# Patient Record
Sex: Female | Born: 1965 | Race: White | Hispanic: No | Marital: Married | Smoking: Current every day smoker
Health system: Southern US, Community
[De-identification: ages and names within clinical notes are randomized; demographics above are authoritative.]

## PROBLEM LIST (undated history)

## (undated) ENCOUNTER — Emergency Department: Discharge status: Absent without leave | Payer: Self-pay

## (undated) DIAGNOSIS — G35 Multiple sclerosis: Secondary | ICD-10-CM

## (undated) DIAGNOSIS — G35D Multiple sclerosis, unspecified: Secondary | ICD-10-CM

## (undated) DIAGNOSIS — N261 Atrophy of kidney (terminal): Secondary | ICD-10-CM

## (undated) DIAGNOSIS — R079 Chest pain, unspecified: Secondary | ICD-10-CM

## (undated) DIAGNOSIS — G629 Polyneuropathy, unspecified: Secondary | ICD-10-CM

## (undated) DIAGNOSIS — E079 Disorder of thyroid, unspecified: Secondary | ICD-10-CM

## (undated) DIAGNOSIS — R002 Palpitations: Secondary | ICD-10-CM

## (undated) DIAGNOSIS — N200 Calculus of kidney: Secondary | ICD-10-CM

## (undated) DIAGNOSIS — G8929 Other chronic pain: Secondary | ICD-10-CM

## (undated) DIAGNOSIS — J449 Chronic obstructive pulmonary disease, unspecified: Secondary | ICD-10-CM

## (undated) DIAGNOSIS — R0602 Shortness of breath: Secondary | ICD-10-CM

## (undated) DIAGNOSIS — M1990 Unspecified osteoarthritis, unspecified site: Secondary | ICD-10-CM

## (undated) HISTORY — DX: Chest pain, unspecified: R07.9

## (undated) HISTORY — DX: Shortness of breath: R06.02

## (undated) HISTORY — PX: LAPAROSCOPIC NEPHRECTOMY: SUR781

## (undated) HISTORY — PX: NEPHRECTOMY: SHX65

## (undated) HISTORY — DX: Palpitations: R00.2

## (undated) HISTORY — PX: KIDNEY STONE SURGERY: SHX686

## (undated) HISTORY — PX: CHOLECYSTECTOMY: SHX55

---

## 2000-06-10 ENCOUNTER — Encounter (HOSPITAL_BASED_OUTPATIENT_CLINIC_OR_DEPARTMENT_OTHER): Payer: Self-pay | Admitting: General Surgery

## 2000-06-10 ENCOUNTER — Encounter: Admission: RE | Admit: 2000-06-10 | Discharge: 2000-06-10 | Payer: Self-pay | Admitting: General Surgery

## 2009-07-20 ENCOUNTER — Ambulatory Visit: Payer: Self-pay | Admitting: Family Medicine

## 2009-07-20 DIAGNOSIS — M79609 Pain in unspecified limb: Secondary | ICD-10-CM

## 2009-07-20 DIAGNOSIS — E039 Hypothyroidism, unspecified: Secondary | ICD-10-CM | POA: Insufficient documentation

## 2009-10-27 ENCOUNTER — Observation Stay (HOSPITAL_COMMUNITY): Admission: EM | Admit: 2009-10-27 | Discharge: 2009-10-28 | Payer: Self-pay | Admitting: Emergency Medicine

## 2010-06-12 NOTE — Assessment & Plan Note (Signed)
Summary: INJURY TO LEFT FOOT/TJ   Vital Signs:  Patient Profile:   45 Years Old Female CC:      left foot pain Height:     61.5 inches Weight:      203 pounds O2 Sat:      99 % O2 treatment:    Room Air Temp:     97.5 degrees F oral Pulse rate:   63 / minute Resp:     18 per minute BP sitting:   149 / 86  (right arm)  Pt. in pain?   yes    Location:   left foot    Intensity:   5    Type:       aching  Vitals Entered By: Lajean Saver RN (July 20, 2009 12:13 PM)                   Updated Prior Medication List: SYNTHROID 150 MCG TABS (LEVOTHYROXINE SODIUM) once daily ALPRAZOLAM 1 MG TABS (ALPRAZOLAM) prn  Current Allergies: No known allergies History of Present Illness Chief Complaint: left foot pain History of Present Illness: KICKED A BEDPOST 8 MONTHS AGO. CONTINUES TO HAVE INTERMITTANT PAIN LEFT FOOT. KNOWS SHE HAD FLAT FEET BUT THIS IS DIFFERENT. PAIN OVER THE DORSOLATERAL PART OF THE FOOT. STATE ON OCC WILL TURN BLUE AND SWELL. ABLE TO BEAR WEIGHT.  REVIEW OF SYSTEMS Constitutional Symptoms      Denies fever, chills, night sweats, weight loss, weight gain, and fatigue.  Eyes       Denies change in vision, eye pain, eye discharge, glasses, contact lenses, and eye surgery. Ear/Nose/Throat/Mouth       Denies hearing loss/aids, change in hearing, ear pain, ear discharge, dizziness, frequent runny nose, frequent nose bleeds, sinus problems, sore throat, hoarseness, and tooth pain or bleeding.  Respiratory       Denies dry cough, productive cough, wheezing, shortness of breath, asthma, bronchitis, and emphysema/COPD.  Cardiovascular       Denies murmurs, chest pain, and tires easily with exhertion.    Gastrointestinal       Denies stomach pain, nausea/vomiting, diarrhea, constipation, blood in bowel movements, and indigestion. Genitourniary       Denies painful urination, kidney stones, and loss of urinary control. Neurological       Denies paralysis, seizures,  and fainting/blackouts. Musculoskeletal       Complains of muscle pain, joint pain, and swelling.      Denies joint stiffness, decreased range of motion, redness, muscle weakness, and gout.      Comments: left foot Skin       Denies bruising, unusual mles/lumps or sores, and hair/skin or nail changes.  Psych       Denies mood changes, temper/anger issues, anxiety/stress, speech problems, depression, and sleep problems. Other Comments: hit left foot on bed last summer   Past History:  Past Medical History: Hypothyroidism  Past Surgical History: Caesarean section Tonsillectomy Cholecystectomy  Family History: unremarkable  Social History: Occupation: Conservation officer, [employer] Married Current Smoker 3/4 PPD Alcohol use-no Drug use-no Smoking Status:  current Drug Use:  no Physical Exam General appearance: well developed, well nourished, no acute distress Extremities: EVAL OF THE LEFT FOOT REVEALS NO SWELLING . PES PLANUS NOTED. GOOD DP PULSE. N/V INTACT DISTALLY. TENDER DORSUM OF THE FOOT.  ROM INTACT.  xray neg for acute injury. poss healed fx of the 4th toe.  Assessment New Problems: FOOT PAIN, LEFT (ICD-729.5) HYPOTHYROIDISM (ICD-244.9)   Plan New Medications/Changes: MEDROL (PAK)  4 MG TABS (METHYLPREDNISOLONE) TAKE AS DIRECTED WITH FOOD  #1PK x 0, 07/20/2009, Marvis Moeller DO  New Orders: T-DG Foot Complete*L* [73630] New Patient Level III [99203]   Prescriptions: MEDROL (PAK) 4 MG TABS (METHYLPREDNISOLONE) TAKE AS DIRECTED WITH FOOD  #1PK x 0   Entered and Authorized by:   Marvis Moeller DO   Signed by:   Marvis Moeller DO on 07/20/2009   Method used:   Print then Give to Patient   RxID:   [id number]   Patient Instructions: 1)  WEAR SHOE WHEN UP AND ABOUT. WARM SOAKS TWICE DAILY FOR 15 MIN. FOLLOW UP WITH ORTHO IF CONTINUED SYMPTOMS AFTER 10 DAYS. ELEVATE AS MUCH AS POSS.

## 2010-06-14 ENCOUNTER — Other Ambulatory Visit (HOSPITAL_COMMUNITY): Payer: Self-pay | Admitting: Urology

## 2010-06-14 DIAGNOSIS — N261 Atrophy of kidney (terminal): Secondary | ICD-10-CM

## 2010-06-15 ENCOUNTER — Emergency Department (HOSPITAL_COMMUNITY)
Admission: EM | Admit: 2010-06-15 | Discharge: 2010-06-15 | Discharge status: Against Medical Advice | Payer: Self-pay | Attending: Emergency Medicine | Admitting: Emergency Medicine

## 2010-06-21 ENCOUNTER — Encounter (HOSPITAL_COMMUNITY)
Admission: RE | Admit: 2010-06-21 | Discharge: 2010-06-21 | Disposition: A | Discharge status: Home / Self Care | Payer: 59 | Source: Ambulatory Visit | Attending: Urology | Admitting: Urology

## 2010-06-21 ENCOUNTER — Encounter (HOSPITAL_COMMUNITY): Payer: Self-pay

## 2010-06-21 DIAGNOSIS — N269 Renal sclerosis, unspecified: Secondary | ICD-10-CM | POA: Insufficient documentation

## 2010-06-21 DIAGNOSIS — N261 Atrophy of kidney (terminal): Secondary | ICD-10-CM

## 2010-06-21 HISTORY — DX: Atrophy of kidney (terminal): N26.1

## 2010-06-21 MED ORDER — TECHNETIUM TC 99M MERTIATIDE
15.0000 | Freq: Once | INTRAVENOUS | Status: AC | PRN
  Administered 2010-06-21: 14 via INTRAVENOUS

## 2010-07-10 ENCOUNTER — Other Ambulatory Visit: Payer: Self-pay | Admitting: Urology

## 2010-07-10 ENCOUNTER — Encounter (HOSPITAL_COMMUNITY): Payer: 59

## 2010-07-10 LAB — CBC
HCT: 41.6 % (ref 36.0–46.0)
Hemoglobin: 13.3 g/dL (ref 12.0–15.0)
MCH: 28.8 pg (ref 26.0–34.0)
MCHC: 32 g/dL (ref 30.0–36.0)
MCV: 90 fL (ref 78.0–100.0)
Platelets: 254 10*3/uL (ref 150–400)
RBC: 4.62 MIL/uL (ref 3.87–5.11)
RDW: 12.9 % (ref 11.5–15.5)
WBC: 7.2 10*3/uL (ref 4.0–10.5)

## 2010-07-10 LAB — BASIC METABOLIC PANEL
BUN: 10 mg/dL (ref 6–23)
CO2: 27 mEq/L (ref 19–32)
Calcium: 8.7 mg/dL (ref 8.4–10.5)
Chloride: 107 mEq/L (ref 96–112)
Creatinine, Ser: 0.76 mg/dL (ref 0.4–1.2)
GFR calc Af Amer: >60 mL/min (ref >60)
GFR calc non Af Amer: >60 mL/min (ref >60)
Glucose, Bld: 81 mg/dL (ref 70–99)
Potassium: 4.6 mEq/L (ref 3.5–5.1)
Sodium: 140 mEq/L (ref 135–145)

## 2010-07-10 LAB — HCG, SERUM, QUALITATIVE: Preg, Serum: NEGATIVE

## 2010-07-10 LAB — SURGICAL PCR SCREEN
MRSA, PCR: NEGATIVE
Staphylococcus aureus: NEGATIVE

## 2010-07-16 ENCOUNTER — Other Ambulatory Visit: Payer: Self-pay | Admitting: Urology

## 2010-07-16 ENCOUNTER — Inpatient Hospital Stay (HOSPITAL_COMMUNITY)
Admission: RE | Admit: 2010-07-16 | Discharge: 2010-07-18 | DRG: 661 | Disposition: A | Discharge status: Home / Self Care | Payer: 59 | Source: Ambulatory Visit | Attending: Urology | Admitting: Urology

## 2010-07-16 DIAGNOSIS — N1330 Unspecified hydronephrosis: Secondary | ICD-10-CM | POA: Diagnosis present

## 2010-07-16 DIAGNOSIS — E039 Hypothyroidism, unspecified: Secondary | ICD-10-CM | POA: Diagnosis present

## 2010-07-16 DIAGNOSIS — Z9889 Other specified postprocedural states: Secondary | ICD-10-CM

## 2010-07-16 DIAGNOSIS — F17200 Nicotine dependence, unspecified, uncomplicated: Secondary | ICD-10-CM | POA: Diagnosis present

## 2010-07-16 DIAGNOSIS — N12 Tubulo-interstitial nephritis, not specified as acute or chronic: Secondary | ICD-10-CM | POA: Diagnosis present

## 2010-07-16 DIAGNOSIS — N289 Disorder of kidney and ureter, unspecified: Principal | ICD-10-CM | POA: Diagnosis present

## 2010-07-16 LAB — BASIC METABOLIC PANEL
GFR calc Af Amer: >60 mL/min (ref >60)
GFR calc non Af Amer: >60 mL/min (ref >60)
Glucose, Bld: 122 mg/dL — ABNORMAL HIGH (ref 70–99)
Potassium: 4.3 mEq/L (ref 3.5–5.1)
Sodium: 139 mEq/L (ref 135–145)

## 2010-07-16 LAB — TYPE AND SCREEN: ABO/RH(D): A POS

## 2010-07-16 LAB — HEMOGLOBIN AND HEMATOCRIT, BLOOD: Hemoglobin: 14.1 g/dL (ref 12.0–15.0)

## 2010-07-17 LAB — BASIC METABOLIC PANEL
CO2: 29 mEq/L (ref 19–32)
Calcium: 8.5 mg/dL (ref 8.4–10.5)
Creatinine, Ser: 0.63 mg/dL (ref 0.4–1.2)
GFR calc Af Amer: >60 mL/min (ref >60)
GFR calc non Af Amer: >60 mL/min (ref >60)
Sodium: 140 mEq/L (ref 135–145)

## 2010-07-17 LAB — HEMOGLOBIN AND HEMATOCRIT, BLOOD
HCT: 35.3 % — ABNORMAL LOW (ref 36.0–46.0)
HCT: 36 % (ref 36.0–46.0)
Hemoglobin: 11.5 g/dL — ABNORMAL LOW (ref 12.0–15.0)

## 2010-07-20 NOTE — Op Note (Signed)
NAMEGENTRY, SEEBER                 ACCOUNT NO.:  [id number]  MEDICAL RECORD NO.:  [id number]           PATIENT TYPE:  I  LOCATION:  X002                         FACILITY:  [facility]  PHYSICIAN:  Heloise Purpura, MD      DATE OF BIRTH:  17-Jun-1965  DATE OF PROCEDURE:  07/16/2010 DATE OF DISCHARGE:                              OPERATIVE REPORT   PREOPERATIVE DIAGNOSES: 1. 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 2. Nonfunctional right kidney with recurrent pyelonephritis.  POSTOPERATIVE DIAGNOSES: 1. 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 2. Nonfunctional right kidney with recurrent pyelonephritis.  PROCEDURE:  Right laparoscopic nephrectomy.  SURGEON:  Heloise Purpura, M.D.  ASSISTANT:  Delia Chimes, [facility]  ANESTHESIA:  General.  COMPLICATIONS:  None.  ESTIMATED BLOOD LOSS:  75 cc.  INTRAVENOUS FLUIDS:  2 liters of lactated Ringer's.  SPECIMENS:  Right kidney and proximal ureter.  DISPOSITION OF SPECIMENS:  To Pathology.  INDICATION:  Ms. Sabourin is a 45 year old female with a complex urologic history.  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 Dannette Barbara in the mid 1980s.  She also has undergone a ureteral reimplantation around that time as well.  Over the past few years, she has had issues related to recurrent right-sided pyelonephritis and right-sided flank pain.  Her most recent evaluation by Dr. Vonita Moss revealed a nonfunctional right kidney.  After discussing management options for treatment, she elected to proceed with the above procedure.  The potential risks, complications, and alternative treatment options were discussed in detail and informed consent was obtained.  DESCRIPTION OF PROCEDURE:  The patient was taken to the operating room and a general anesthetic was administered.  She was given  preoperative antibiotics, placed in the right modified flank position, and prepped and draped in the usual sterile fashion.  Care was taken to pad all potential pressure points.  A preoperative time-out was then performed. A site was selected off to the right of the umbilicus based on her body habitus.  This was used to place the camera port in the standard open Hassan technique, which allowed entry into the peritoneal cavity under direct vision and without difficulty, 0 Vicryl holding sutures were then placed in the abdominal wall fascia and used to hold the Hasson cannula in place.  A pneumoperitoneum was established and the abdomen was inspected.  There were noted to be a large amount of adhesions between where the camera was placed and the right upper quadrant, likely related to her prior laparoscopic cholecystectomy.  A 5-mm port was then placed in the right upper quadrant and a 12-mm port placed in the right lower quadrant away from the adhesions.  The previously mentioned adhesions were then taken down with laparoscopic scissors.  This exposed the right upper quadrant of the abdomen.  The patient's liver was also somewhat adhesed to the underlying peritoneum and these adhesions and attachments were taken down with a harmonic scalpel, allowing the liver to be retracted  cephalad.  An additional 5-mm port was then placed in the right lateral abdominal wall and a self-retaining laparoscopic liver retractor was placed and used to hold the liver in a cephalad direction. Although the original tissue planes were somewhat obscured from the patient's prior right renal surgeries, the colon was able to be mobilized medially with a harmonic scalpel and the space between the ureter and the psoas muscle was able to be identified.  The ureter was densely adherent to the psoas muscle with no true anatomical plane identified.  The ureter was also noted to be extremely dilated, likely related to her  history of chronic reflux in the past.  The posterior plane of the kidney was gradually developed with the harmonic scalpel and the kidney was lifted anteriorly, thereby isolating the renal hilum. A single renal vein was identified.  There was no large renal artery seen.  Multiple smaller vessels were seen around the renal vein that were felt to potentially represent a remnant of the renal artery, which was likely small and atrophic and 5-mm Hem-o-Lok clips were used to control this tissue.  Once the renal vein was isolated from the surrounding structures, a 15-mm Hem-o-Lok clip was placed onto the renal vein.  An additional 15-mm and 10-mm Hem-o-Lok clips were used to complete ligation of the renal vein, which was then divided with three clips left on the vena cava side of the renal vein.  The superior attachments to the kidney were then taken down with the harmonic scalpel as were the lateral attachments.  The ureter was dissected down to a point just above the iliac vessels where it was isolated and divided between multiple 15-mm Hem-o-Lok clips.  This allowed the kidney and proximal ureter to be free from the surrounding structures in preparation for removal.  The renal fossa was then carefully examined. 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  For this reason, the adrenal gland was not removed.  In addition, the renal fossa was then copiously irrigated and hemostasis was ensured.  Preparations were then made for closure and removal of the kidney.  A 12-mm right lower quadrant port site was closed with a 0 Vicryl suture placed laparoscopically.  The remaining ports were removed under direct vision and the kidney specimen was placed into a 15-mm EndoCatch II bag and removed via the original camera port incision, which was only slightly extended in order to remove the  kidney.  This fascial opening was then closed with two running 0 Vicryl sutures. Quarter-percent Marcaine was then injected into all port sites, which were reapproximated at the skin level with 4-0 Monocryl subcuticular closures.  Dermabond was then applied to the skin.  She tolerated the procedure well without complications.  She was able to be extubated and transferred to the recovery unit in satisfactory condition.     Heloise Purpura, MD     LB/MEDQ  D:  07/16/2010  T:  07/16/2010  Job:  [id number]  Electronically Signed by Heloise Purpura MD on 07/20/2010 11:06:10 PM

## 2010-07-29 LAB — COMPREHENSIVE METABOLIC PANEL
ALT: 12 U/L (ref 0–35)
AST: 15 U/L (ref 0–37)
Alkaline Phosphatase: 52 U/L (ref 39–117)
CO2: 23 mEq/L (ref 19–32)
Calcium: 8.6 mg/dL (ref 8.4–10.5)
GFR calc Af Amer: >60 mL/min (ref >60)
GFR calc non Af Amer: >60 mL/min (ref >60)
Glucose, Bld: 103 mg/dL — ABNORMAL HIGH (ref 70–99)
Potassium: 3.6 mEq/L (ref 3.5–5.1)
Sodium: 139 mEq/L (ref 135–145)
Total Protein: 6.7 g/dL (ref 6.0–8.3)

## 2010-07-29 LAB — CBC
Hemoglobin: 13.2 g/dL (ref 12.0–15.0)
MCHC: 33.3 g/dL (ref 30.0–36.0)
MCHC: 34.3 g/dL (ref 30.0–36.0)
MCV: 91.3 fL (ref 78.0–100.0)
Platelets: 191 10*3/uL (ref 150–400)
RBC: 4.27 MIL/uL (ref 3.87–5.11)

## 2010-07-29 LAB — URINALYSIS, ROUTINE W REFLEX MICROSCOPIC
Bilirubin Urine: NEGATIVE
Ketones, ur: NEGATIVE mg/dL
Nitrite: NEGATIVE
Protein, ur: 100 mg/dL — AB
Urobilinogen, UA: 1 mg/dL (ref 0.0–1.0)

## 2010-07-29 LAB — DIFFERENTIAL
Basophils Relative: 0 % (ref 0–1)
Eosinophils Absolute: 0.1 10*3/uL (ref 0.0–0.7)
Eosinophils Relative: 1 % (ref 0–5)
Lymphs Abs: 1.1 10*3/uL (ref 0.7–4.0)
Monocytes Relative: 6 % (ref 3–12)
Neutrophils Relative %: 86 % — ABNORMAL HIGH (ref 43–77)

## 2010-07-29 LAB — URINE CULTURE

## 2010-07-29 LAB — POCT PREGNANCY, URINE: Preg Test, Ur: NEGATIVE

## 2011-02-25 ENCOUNTER — Ambulatory Visit
Admission: RE | Admit: 2011-02-25 | Discharge: 2011-02-25 | Disposition: A | Discharge status: Home / Self Care | Payer: 59 | Source: Ambulatory Visit | Attending: Unknown Physician Specialty | Admitting: Unknown Physician Specialty

## 2011-02-25 ENCOUNTER — Other Ambulatory Visit: Payer: Self-pay | Admitting: Unknown Physician Specialty

## 2011-02-25 DIAGNOSIS — R51 Headache: Secondary | ICD-10-CM

## 2011-06-02 ENCOUNTER — Encounter: Payer: Self-pay | Admitting: Emergency Medicine

## 2011-06-02 ENCOUNTER — Emergency Department: Admit: 2011-06-02 | Discharge: 2011-06-02 | Disposition: A | Discharge status: Home / Self Care | Payer: 59

## 2011-06-02 ENCOUNTER — Emergency Department (INDEPENDENT_AMBULATORY_CARE_PROVIDER_SITE_OTHER)
Admission: EM | Admit: 2011-06-02 | Discharge: 2011-06-02 | Disposition: A | Discharge status: Home / Self Care | Payer: 59 | Source: Home / Self Care | Attending: Emergency Medicine | Admitting: Emergency Medicine

## 2011-06-02 DIAGNOSIS — M79604 Pain in right leg: Secondary | ICD-10-CM

## 2011-06-02 DIAGNOSIS — M79609 Pain in unspecified limb: Secondary | ICD-10-CM

## 2011-06-02 HISTORY — DX: Disorder of thyroid, unspecified: E07.9

## 2011-06-02 HISTORY — DX: Calculus of kidney: N20.0

## 2011-06-02 MED ORDER — KETOROLAC TROMETHAMINE 30 MG/ML IM SOLN
30.0000 mg | Freq: Once | INTRAMUSCULAR | Status: AC
  Administered 2011-06-02: 30 mg via INTRAMUSCULAR

## 2011-06-02 MED ORDER — KETOROLAC TROMETHAMINE 60 MG/2ML IM SOLN: 60.0000 mg | Freq: Once | INTRAMUSCULAR | Status: DC

## 2011-06-02 NOTE — ED Notes (Signed)
Right leg pain x 6 months.

## 2011-06-02 NOTE — ED Provider Notes (Signed)
A full History     CSN: [id number]  Arrival date & time 06/02/11  1631   First MD Initiated Contact with Patient 06/02/11 1649      No chief complaint on file.   (Consider location/radiation/quality/duration/timing/severity/associated sxs/prior treatment) HPI This patient presents today with right leg pain for the last 6 months. It is located on the anterior aspect of her right shin. She does not recall any type of trauma or accidents or any other types of injuries. She does work at [employer] and is frequently on her feet all day long. She has not been using any types of medicines or modalities for this but she states that the pain is getting worse. No posterior leg pain shortness of breath or any other signs of blood clot.  Past Medical History  Diagnosis Date  . Right renal atrophy     No past surgical history on file.  No family history on file.  History  Substance Use Topics  . Smoking status: Not on file  . Smokeless tobacco: Not on file  . Alcohol Use: Not on file    OB History    Grav Para Term Preterm Abortions TAB SAB Ect Mult Living                  Review of Systems  Allergies  Review of patient's allergies indicates no known allergies.  Home Medications  No current outpatient prescriptions on file.  LMP 05/19/2011  Physical Exam  Nursing note and vitals reviewed. Constitutional: She is oriented to person, place, and time. She appears well-developed and well-nourished.  HENT:  Head: Normocephalic and atraumatic.  Eyes: No scleral icterus.  Neck: Neck supple.  Cardiovascular: Regular rhythm and normal heart sounds.   Pulmonary/Chest: Effort normal and breath sounds normal. No respiratory distress.  Musculoskeletal:       Right lower leg examination demonstrates tenderness in the middle third of the lateral aspect of the tibia as well as the fibula and the intraosseous area. There is mild swelling located in this location. No bruising is seen.  Axial loading and lateral compression do not cause any pain. She is no posterior pain or swelling or any other signs of a DVT. Gait is normal. Distal neurovascular status is intact.  Neurological: She is alert and oriented to person, place, and time.  Skin: Skin is warm and dry.  Psychiatric: She has a normal mood and affect. Her speech is normal.    ED Course  Procedures (including critical care time)  Labs Reviewed - No data to display Dg Tibia/fibula Right  06/02/2011  *RADIOLOGY REPORT*  Clinical Data: Right mid lower leg pain.  No known injury.  RIGHT TIBIA AND FIBULA - 2 VIEW  Comparison: None.  Findings: The mineralization and alignment are normal.  There is no evidence of acute fracture or dislocation.  No focal soft tissue abnormalities are identified.  There is a possible small dermal calcification anterior to the proximal right tibia on the lateral view (versus artifact).  Tricompartmental degenerative changes are noted at the knee.  IMPRESSION: No acute osseous findings.  Original Report Authenticated By: Gerrianne Scale, M.D.     1. Right leg pain       MDM   An x-ray is obtained and is read by radiology as above.  I feel that the mineralization seen on the one view of the x-ray is an artifact since her pain is inferior to that spot..  Encourage rest, ice,  compression with ACE bandage and/or a brace, and elevation of injured body part.   We gave her a shot of Toradol here in clinic. In addition I have sent her to Dr. Pearletha Forge in sports medicine for him to look at her and see if there is a reason why she's having this pain versus evaluating her feet and lower leg for place that she could improve her gait and decrease her pain.  Lily Kocher, MD 06/02/11 [phone]

## 2011-06-03 ENCOUNTER — Telehealth: Payer: Self-pay | Admitting: *Deleted

## 2011-06-04 ENCOUNTER — Telehealth: Payer: Self-pay | Admitting: *Deleted

## 2011-06-04 ENCOUNTER — Ambulatory Visit: Payer: 59 | Admitting: Family Medicine

## 2012-03-20 ENCOUNTER — Encounter (HOSPITAL_COMMUNITY): Payer: Self-pay | Admitting: Emergency Medicine

## 2012-03-20 ENCOUNTER — Observation Stay (HOSPITAL_COMMUNITY)
Admission: EM | Admit: 2012-03-20 | Discharge: 2012-03-21 | Disposition: A | Discharge status: Home / Self Care | Payer: 59 | Attending: Emergency Medicine | Admitting: Emergency Medicine

## 2012-03-20 ENCOUNTER — Emergency Department (HOSPITAL_COMMUNITY): Payer: 59

## 2012-03-20 DIAGNOSIS — E079 Disorder of thyroid, unspecified: Secondary | ICD-10-CM | POA: Insufficient documentation

## 2012-03-20 DIAGNOSIS — R0602 Shortness of breath: Secondary | ICD-10-CM | POA: Insufficient documentation

## 2012-03-20 DIAGNOSIS — R079 Chest pain, unspecified: Principal | ICD-10-CM | POA: Insufficient documentation

## 2012-03-20 DIAGNOSIS — N200 Calculus of kidney: Secondary | ICD-10-CM | POA: Insufficient documentation

## 2012-03-20 DIAGNOSIS — N318 Other neuromuscular dysfunction of bladder: Secondary | ICD-10-CM | POA: Insufficient documentation

## 2012-03-20 DIAGNOSIS — F17200 Nicotine dependence, unspecified, uncomplicated: Secondary | ICD-10-CM | POA: Insufficient documentation

## 2012-03-20 DIAGNOSIS — R002 Palpitations: Secondary | ICD-10-CM | POA: Insufficient documentation

## 2012-03-20 DIAGNOSIS — R110 Nausea: Secondary | ICD-10-CM | POA: Insufficient documentation

## 2012-03-20 LAB — BASIC METABOLIC PANEL
BUN: 9 mg/dL (ref 6–23)
CO2: 24 mEq/L (ref 19–32)
Calcium: 9.3 mg/dL (ref 8.4–10.5)
Chloride: 102 mEq/L (ref 96–112)
Creatinine, Ser: 0.76 mg/dL (ref 0.50–1.10)
GFR calc Af Amer: >90 mL/min (ref >90)
GFR calc non Af Amer: >90 mL/min (ref >90)
Glucose, Bld: 83 mg/dL (ref 70–99)
Potassium: 3.6 mEq/L (ref 3.5–5.1)
Sodium: 136 mEq/L (ref 135–145)

## 2012-03-20 LAB — POCT I-STAT TROPONIN I
Troponin i, poc: 0.01 ng/mL (ref 0.00–0.08)
Troponin i, poc: 0.01 ng/mL (ref 0.00–0.08)

## 2012-03-20 LAB — CBC
HCT: 41.9 % (ref 36.0–46.0)
Hemoglobin: 14.4 g/dL (ref 12.0–15.0)
MCH: 29.8 pg (ref 26.0–34.0)
MCHC: 34.4 g/dL (ref 30.0–36.0)
MCV: 86.7 fL (ref 78.0–100.0)
Platelets: 265 10*3/uL (ref 150–400)
RBC: 4.83 MIL/uL (ref 3.87–5.11)
RDW: 12.5 % (ref 11.5–15.5)
WBC: 13.3 10*3/uL — ABNORMAL HIGH (ref 4.0–10.5)

## 2012-03-20 LAB — PRO B NATRIURETIC PEPTIDE: Pro B Natriuretic peptide (BNP): 143 pg/mL — ABNORMAL HIGH (ref 0–125)

## 2012-03-20 MED ORDER — NITROGLYCERIN 0.4 MG SL SUBL
0.4000 mg | SUBLINGUAL_TABLET | SUBLINGUAL | Status: DC | PRN
  Administered 2012-03-20: 0.4 mg via SUBLINGUAL
  Filled 2012-03-20: qty 25

## 2012-03-20 MED ORDER — LORAZEPAM 2 MG/ML IJ SOLN
1.0000 mg | Freq: Once | INTRAMUSCULAR | Status: AC
  Administered 2012-03-21: 1 mg via INTRAVENOUS
  Filled 2012-03-20: qty 1

## 2012-03-20 MED ORDER — ASPIRIN 81 MG PO CHEW
324.0000 mg | CHEWABLE_TABLET | Freq: Once | ORAL | Status: AC
  Administered 2012-03-20: 324 mg via ORAL
  Filled 2012-03-20: qty 1
  Filled 2012-03-20: qty 3

## 2012-03-20 NOTE — ED Notes (Signed)
Pt presenting to ed with c/o chest pain with shortness of breath and headache pain. Pt states positive nausea no vomiting at this time

## 2012-03-20 NOTE — ED Notes (Signed)
Patient has decided to proceed with additional testing and agrees to being transferred to [facility]. Phelbotomy at bedside drawing troponin. Lisette, PA notified

## 2012-03-20 NOTE — ED Notes (Signed)
Reports while @ work around 1000am  experienced chest pain across upper chest area radiating around left rib/upper back/to left elbow. Worse pain rated 7/10 now 2/10. Also accompanied with heart palpitation & felt like she had to take a deep breath to get air in now denies sob. Patient had to leave work due to chest pain got home called family MD @ 1500hrs and instructed to come to the ED. Patient is a smoker 1/2pk/day. Noted tiny wheeze on inhalation.

## 2012-03-20 NOTE — ED Notes (Addendum)
Patient BMI 40.218

## 2012-03-20 NOTE — ED Notes (Signed)
Patient and spouse discussing treatment/testing options. Patient advised to ring for nurse when a decision has been made.

## 2012-03-20 NOTE — ED Provider Notes (Signed)
History     CSN: [id number]  Arrival date & time 03/20/12  1614   First MD Initiated Contact with Patient 03/20/12 2017      Chief Complaint  Patient presents with  . Chest Pain    (Consider location/radiation/quality/duration/timing/severity/associated sxs/prior treatment) HPI Comments: Patient is a 46 year-old current everyday smoker with a history of renal caliculi (laproscopic nephrectomy & cholecystectomy) that presents emergency department with a chief complaint of chest pain.  Onset of symptoms began approximately 10 a.m. while patient was at rest.  Location of pain was substernal, described as an intermittent chest pressure lasting minutes with radiation to left arm. Pain has since completely resolved, severity was rated at a 4/10. Associated symptoms include chest palpitations, shortness of breath and nausea but patient denies any diaphoresis, leg swelling, fever, night sweats, chills, cough, hemoptysis.  Patient is not on hormone replacement therapy and denies a history of diabetes, cardiac issues, high blood pressure or known CAD.  Family history is positive for stroke and MI on mother side.  Patient has never been evaluated by a cardiologist.  Patient is a 46 y.o. female presenting with chest pain. The history is provided by the patient.  Chest Pain Primary symptoms include shortness of breath, palpitations and nausea. Pertinent negatives for primary symptoms include no fever, no fatigue, no cough, no wheezing, no abdominal pain, no vomiting and no dizziness.  The palpitations also occurred with shortness of breath. The palpitations did not occur with syncope or dizziness.  Pertinent negatives for associated symptoms include no diaphoresis.     Past Medical History  Diagnosis Date  . Right renal atrophy   . Thyroid disease   . Renal calculi     Past Surgical History  Procedure Date  . Cesarean section   . Kidney stone surgery   . Cholecystectomy   . Laparoscopic  nephrectomy     No family history on file.  History  Substance Use Topics  . Smoking status: Current Every Day Smoker -- 0.5 packs/day    Types: Cigarettes  . Smokeless tobacco: Not on file  . Alcohol Use: No    OB History    Grav Para Term Preterm Abortions TAB SAB Ect Mult Living                  Review of Systems  Constitutional: Negative for fever, chills, diaphoresis, activity change, fatigue and unexpected weight change.  HENT: Negative for congestion, neck pain and neck stiffness.   Eyes: Negative for visual disturbance.  Respiratory: Positive for chest tightness and shortness of breath. Negative for apnea, cough, wheezing and stridor.   Cardiovascular: Positive for chest pain and palpitations. Negative for leg swelling.  Gastrointestinal: Positive for nausea. Negative for vomiting, abdominal pain, diarrhea and blood in stool.  Genitourinary: Negative for dysuria, urgency, hematuria and flank pain.  Musculoskeletal: Negative for myalgias, back pain and gait problem.  Skin: Negative for pallor.  Neurological: Negative for dizziness, syncope, light-headedness and headaches.  All other systems reviewed and are negative.    Allergies  Demerol  Home Medications   Current Outpatient Rx  Name  Route  Sig  Dispense  Refill  . ALPRAZOLAM 1 MG PO TABS   Oral   Take 1 mg by mouth 3 (three) times daily as needed. anxiety         . ASPIRIN 325 MG PO TABS   Oral   Take 325 mg by mouth once.         Marland Kitchen  LEVOTHYROXINE SODIUM 150 MCG PO TABS   Oral   Take 150 mcg by mouth daily.           BP 143/92  Pulse 56  Temp 98.2 F (36.8 C) (Oral)  Resp 16  SpO2 99%  LMP 03/16/2012  Physical Exam  Nursing note and vitals reviewed. Constitutional: She is oriented to person, place, and time. Vital signs are normal. She appears well-developed and well-nourished. She does not have a sickly appearance. No distress.  HENT:  Head: Normocephalic and atraumatic.  Right Ear:  Hearing, tympanic membrane, external ear and ear canal normal.  Left Ear: Hearing, tympanic membrane, external ear and ear canal normal.  Eyes: Conjunctivae normal and EOM are normal. Pupils are equal, round, and reactive to light. No scleral icterus.  Neck: Normal range of motion and full passive range of motion without pain. Neck supple. Normal carotid pulses and no JVD present. Carotid bruit is not present. No rigidity. Normal range of motion present. No Brudzinski's sign noted.  Cardiovascular: Normal rate, regular rhythm, S1 normal, S2 normal, normal heart sounds, intact distal pulses and normal pulses.  Exam reveals no gallop and no friction rub.   No murmur heard.      No pitting edema bilaterally, RRR, no aberrant sounds on auscultations, distal pulses intact, no carotid bruit or JVD.   Pulmonary/Chest: Effort normal and breath sounds normal. No accessory muscle usage or stridor. No respiratory distress. She has no wheezes. She exhibits no tenderness and no bony tenderness.  Abdominal: Bowel sounds are normal.       Obese, Soft non tender. Non pulsatile aorta.   Musculoskeletal: Normal range of motion.  Neurological: She is alert and oriented to person, place, and time. She has normal strength. No cranial nerve deficit or sensory deficit. She displays a negative Romberg sign. Coordination and gait normal. GCS eye subscore is 4. GCS verbal subscore is 5. GCS motor subscore is 6.  Skin: Skin is warm, dry and intact. No rash noted. She is not diaphoretic. No cyanosis. Nails show no clubbing.  Psychiatric: She has a normal mood and affect. Her behavior is normal.    ED Course  Procedures (including critical care time)  Labs Reviewed  CBC - Abnormal; Notable for the following:    WBC 13.3 (*)     All other components within normal limits  PRO B NATRIURETIC PEPTIDE - Abnormal; Notable for the following:    Pro B Natriuretic peptide (BNP) 143.0 (*)     All other components within normal  limits  BASIC METABOLIC PANEL  POCT I-STAT TROPONIN I   Dg Chest 2 View  03/20/2012  *RADIOLOGY REPORT*  Clinical Data: Chest pain  CHEST - 2 VIEW  Comparison: None  Findings: The heart size and mediastinal contours are within normal limits.  Both lungs are clear. Multilevel spondylosis within the thoracic spine noted.  IMPRESSION: No active cardiopulmonary abnormalities.   Original Report Authenticated By: Signa Kell, M.D.     Date: 03/20/2012  Rate: 79  Rhythm: normal sinus rhythm  QRS Axis: normal  Intervals: normal  ST/T Wave abnormalities: normal  Conduction Disutrbances: none  Narrative Interpretation:   Old EKG Reviewed: No old EKG     No diagnosis found.    MDM  CHest pain- Transfer to [facility] for [facility]PP  Pt is a 46 yo F smoker that presented to the ER w CP onset 10 am, currently CP free.  Labs and imaging reviewed, CXR and ECG  with no acute abnormalities, and neg troponins x2. Pt to be transferred to [facility] on CPP for stress test in the morning (unable to perform Coronary CT d/t BMI of 40.2). No CAD hx, Heart score of 3. Case discussed w attending Dr. Juleen China who agrees with transfer.       Jaci Carrel, [place] 03/20/12 2257

## 2012-03-21 MED ORDER — LORAZEPAM 2 MG/ML IJ SOLN
1.0000 mg | Freq: Once | INTRAMUSCULAR | Status: AC
  Administered 2012-03-21: 1 mg via INTRAVENOUS
  Filled 2012-03-21: qty 1

## 2012-03-21 NOTE — ED Notes (Signed)
Transfer request made to [facility].

## 2012-03-21 NOTE — ED Notes (Signed)
Pt requesting Ativan for anxiety, EDP Powers gave a VO for Ativan 1 mg IVP for a one time dose

## 2012-03-21 NOTE — ED Notes (Signed)
Pt informed the test she is scheduled for is not available until Monday morning, pt made aware d/t this RN was informed in report pt was not wanting to stay overnight in the hospital and was explained in detail the risk of not staying for f/u testing.  Pt is unhappy with this report, requesting the phone to call her husband. Pt given the phone, this RN sts to pt "please call me after you talk w/your husband so I can make one of our ED dr's aware of your plan. If you are willing to stay we will need to admit you to the hospital and if not the ED dr needs to talk with you and your spouse to come up w/another plan." Pt agreed she would call me after she spoke w/her husband

## 2012-03-21 NOTE — ED Notes (Signed)
Pt's spouse arrives to ED, comes to the nurses station and says to this RN, "get her paperwork she's going home."  This RN informed spouse "I would need to get one of our ED dr's involved w/this, and I encouraged the pa to let me know her decision after talking w/you so we could have a plan in order." Spouse then sts "we do have a plan she's going home." I then informed pt EDP Powers was not willing to d/c pt home d/t pt was on the CPP and he did not feel comfortable sending pt home. Pt reports they were informed at [facility] ED that pt was w/in the BMI parameters to have the test completed tomorrow. EDP Powers informed, he will come to talk w/pt and spouse

## 2012-03-21 NOTE — ED Provider Notes (Signed)
7:35 AM Patient was sent to CDU from [facility] last night for chest pain protocol.  However, patient's BMI is 40 and patient is therefore not able to have chest pain protocol test that is performed on the weekends (coronary CT).  Exercise stress echo is not available on the weekends.  Prior to my arrival today, this was discussed with the patient and her family member who have expressed that they do not want to stay in the ED and do not want to be admitted.  I discussed the patient with Dr Lorenso Courier, who reviewed labs with me, and has decided patient may be discharged home with cardiology follow up.  Patient seen with Dr Lorenso Courier who discussed results and plan with patient and family member.  Pt reports the chest pain was a one-time occurrence.  Her risk factors include obesity, smoking, and prior hx hypertension.  She denies family hx CAD.  Pt has had two negative troponins, normal chest xray.  Pt d/c home with [facility] cardiology follow up.  Pt and family member verbalize understanding and agree with plan.  They have been advised by Dr Lorenso Courier to return to ED for any return of symptoms.    Results for orders placed during the hospital encounter of 03/20/12  CBC      Component Value Range   WBC 13.3 (*) 4.0 - 10.5 K/uL   RBC 4.83  3.87 - 5.11 MIL/uL   Hemoglobin 14.4  12.0 - 15.0 g/dL   HCT 78.2  95.6 - 21.3 %   MCV 86.7  78.0 - 100.0 fL   MCH 29.8  26.0 - 34.0 pg   MCHC 34.4  30.0 - 36.0 g/dL   RDW 08.6  57.8 - 46.9 %   Platelets 265  150 - 400 K/uL  BASIC METABOLIC PANEL      Component Value Range   Sodium 136  135 - 145 mEq/L   Potassium 3.6  3.5 - 5.1 mEq/L   Chloride 102  96 - 112 mEq/L   CO2 24  19 - 32 mEq/L   Glucose, Bld 83  70 - 99 mg/dL   BUN 9  6 - 23 mg/dL   Creatinine, Ser 6.29  0.50 - 1.10 mg/dL   Calcium 9.3  8.4 - 52.8 mg/dL   GFR calc non Af Amer >90  >90 mL/min   GFR calc Af Amer >90  >90 mL/min  PRO B NATRIURETIC PEPTIDE      Component Value Range   Pro B  Natriuretic peptide (BNP) 143.0 (*) 0 - 125 pg/mL  POCT I-STAT TROPONIN I      Component Value Range   Troponin i, poc 0.01  0.00 - 0.08 ng/mL   Comment 3           POCT I-STAT TROPONIN I      Component Value Range   Troponin i, poc 0.01  0.00 - 0.08 ng/mL   Comment 3            Dg Chest 2 View  03/20/2012  *RADIOLOGY REPORT*  Clinical Data: Chest pain  CHEST - 2 VIEW  Comparison: None  Findings: The heart size and mediastinal contours are within normal limits.  Both lungs are clear. Multilevel spondylosis within the thoracic spine noted.  IMPRESSION: No active cardiopulmonary abnormalities.   Original Report Authenticated By: Signa Kell, M.D.       [place], [place] 03/21/12 [phone]

## 2012-03-24 NOTE — ED Provider Notes (Signed)
Medical screening examination/treatment/procedure(s) were performed by non-physician practitioner and as supervising physician I was immediately available for consultation/collaboration.  Ryett Hamman, MD 03/24/12 [phone] 

## 2012-03-24 NOTE — ED Provider Notes (Signed)
Medical screening examination/treatment/procedure(s) were performed by non-physician practitioner and as supervising physician I was immediately available for consultation/collaboration.  Raeford Razor, MD 03/24/12 [phone]

## 2012-03-27 ENCOUNTER — Encounter: Payer: Self-pay | Admitting: *Deleted

## 2012-03-30 ENCOUNTER — Ambulatory Visit (INDEPENDENT_AMBULATORY_CARE_PROVIDER_SITE_OTHER): Payer: 59 | Admitting: Cardiovascular Disease

## 2012-03-30 ENCOUNTER — Encounter: Payer: Self-pay | Admitting: Cardiovascular Disease

## 2012-03-30 VITALS — BP 135/85 | HR 60 | Ht 62.0 in | Wt 218.0 lb

## 2012-03-30 DIAGNOSIS — R079 Chest pain, unspecified: Secondary | ICD-10-CM

## 2012-03-30 DIAGNOSIS — F17200 Nicotine dependence, unspecified, uncomplicated: Secondary | ICD-10-CM

## 2012-03-30 DIAGNOSIS — E039 Hypothyroidism, unspecified: Secondary | ICD-10-CM

## 2012-03-30 NOTE — Progress Notes (Signed)
Patient ID: AHNA KONKLE, female   DOB: [birth year], 46 y.o.   MRN: [id number] Patient was sent to CDU from [facility] 11/8  for chest pain protocol. However, patient's BMI is 40 and patient is therefore not able to have chest pain protocol test that is performed on the weekends (coronary CT). Exercise stress echo is not available on the weekends. Patient seen with Dr Lorenso Courier who discussed results and plan with patient and family member. Pt reports the chest pain was a one-time occurrence. Her risk factors include obesity, smoking, and prior hx hypertension. She denies family hx CAD. Pt has had two negative troponins, normal chest xray. Pt d/c home  Pain nonexertional.  Sharp radiated to left arm  Arm still hurts some.  Smokes 1/2 ppd.  Counseled for less than 10 minutes on cessation Discussed E-cig.  Would be able to walk on treadmill  BSA too big for CT  Despite being overweight she use to be over 300lbs and has done better about diet  ROS: Denies fever, malais, weight loss, blurry vision, decreased visual acuity, cough, sputum, SOB, hemoptysis, pleuritic pain, palpitaitons, heartburn, abdominal pain, melena, lower extremity edema, claudication, or rash.  All other systems reviewed and negative   General: Affect appropriate Overweight white female HEENT: normal Neck supple with no adenopathy JVP normal no bruits no thyromegaly Lungs clear with no wheezing and good diaphragmatic motion Heart:  S1/S2 no murmur,rub, gallop or click PMI normal Abdomen: benighn, BS positve, no tenderness, no AAA no bruit.  No HSM or HJR Distal pulses intact with no bruits No edema Neuro non-focal Skin warm and dry No muscular weakness  Medications Current Outpatient Prescriptions  Medication Sig Dispense Refill  . ALPRAZolam (XANAX) 1 MG tablet Take 1 mg by mouth 3 (three) times daily as needed. anxiety      . aspirin 325 MG tablet Take 325 mg by mouth once.      Marland Kitchen levothyroxine (SYNTHROID,  LEVOTHROID) 150 MCG tablet Take 150 mcg by mouth daily.        Allergies Demerol  Family History: Family History  Problem Relation Age of Onset  . Heart attack      on mothers side of the family   . Stroke      on mothers side of the family     Social History: History   Social History  . Marital Status: Married    Spouse Name: N/A    Number of Children: N/A  . Years of Education: N/A   Occupational History  . Not on file.   Social History Main Topics  . Smoking status: Current Every Day Smoker -- 0.5 packs/day    Types: Cigarettes  . Smokeless tobacco: Not on file  . Alcohol Use: No  . Drug Use: No  . Sexually Active:    Other Topics Concern  . Not on file   Social History Narrative  . No narrative on file    Electrocardiogram:  03/21/12  SR rate 91 poor R wave progression nonspecfic St/T wave changes no acute ischemic changes  Assessment and Plan

## 2012-03-30 NOTE — Assessment & Plan Note (Signed)
Atypical nonspecfic ECG changes F/U stress myovue

## 2012-03-30 NOTE — Assessment & Plan Note (Signed)
Continue replacement TSH q 6 months

## 2012-03-30 NOTE — Assessment & Plan Note (Signed)
CXR in ER 11/8 ok  No active wheezing  Counseled on cessation and use of E-cig

## 2012-03-30 NOTE — Patient Instructions (Addendum)
Your physician recommends that you schedule a follow-up appointment in: AS NEEDED  Your physician recommends that you continue on your current medications as directed. Please refer to the Current Medication list given to you today.  Your physician has requested that you have en exercise stress myoview. For further information please visit [email or web address]. Please follow instruction sheet, as given.  DX CHEST PAIN   

## 2012-04-07 ENCOUNTER — Ambulatory Visit (HOSPITAL_COMMUNITY): Payer: 59 | Attending: Cardiology | Admitting: Radiology

## 2012-04-07 VITALS — BP 108/80 | Ht 62.0 in | Wt 219.0 lb

## 2012-04-07 DIAGNOSIS — R079 Chest pain, unspecified: Secondary | ICD-10-CM | POA: Insufficient documentation

## 2012-04-07 MED ORDER — TECHNETIUM TC 99M SESTAMIBI GENERIC - CARDIOLITE
30.0000 | Freq: Once | INTRAVENOUS | Status: AC | PRN
  Administered 2012-04-07: 30 via INTRAVENOUS

## 2012-04-07 MED ORDER — TECHNETIUM TC 99M SESTAMIBI GENERIC - CARDIOLITE
10.0000 | Freq: Once | INTRAVENOUS | Status: AC | PRN
  Administered 2012-04-07: 10 via INTRAVENOUS

## 2012-04-07 MED ORDER — REGADENOSON 0.4 MG/5ML IV SOLN
0.4000 mg | Freq: Once | INTRAVENOUS | Status: AC
  Administered 2012-04-07: 0.4 mg via INTRAVENOUS

## 2012-04-07 NOTE — Progress Notes (Signed)
[facility] SITE 3 NUCLEAR MED [address] [id number] [place] [address] [phone]  Cardiology Nuclear Med Study  Jennifer Benson is a 46 y.o. female     MRN : [id number]     DOB: [birth year]  Procedure Date: 04/07/2012  Nuclear Med Background Indication for Stress Test:  Evaluation for Ischemia and Abnormal GEX:BMWU R wave progression with nonspecific STT wave changes, 11/8 [facility] CP (-) enzymes History:  Abnormal EKG Cardiac Risk Factors: Family History - CAD, Hypertension, Obesity and Smoker  Symptoms:  Chest Pain   Nuclear Pre-Procedure Caffeine/Decaff Intake:  None NPO After: 8:00pm   Lungs:  clear O2 Sat: 98% on room air. IV 0.9% NS with Angio Cath:  22g  IV Site: R Wrist  IV Started by:  Cathlyn Parsons, RN  Chest Size (in):  42 Cup Size: DD  Height: 5\' 2"  (1.575 m)  Weight:  219 lb (99.338 kg)  BMI:  Body mass index is 40.06 kg/(m^2). Tech Comments:  This patient was unable to reach target HR, so she was switched to a walking Lexiscan.    Nuclear Med Study 1 or 2 day study: 1 day  Stress Test Type:  Treadmill/Lexiscan  Reading MD: Marca Ancona, MD  Order Authorizing Provider:  Burna Cash  Resting Radionuclide: Technetium 41m Sestamibi  Resting Radionuclide Dose: 9.9 mCi   Stress Radionuclide:  Technetium 62m Sestamibi  Stress Radionuclide Dose: 32.1 mCi           Stress Protocol Rest HR: 47 Stress HR: 137  Rest BP: 108/80 Stress BP: 182/73  Exercise Time (min): 7:30 METS: 7.0   Predicted Max HR: 174 bpm % Max HR: 78.74 bpm Rate Pressure Product: 13244   Dose of Adenosine (mg):  n/a Dose of Lexiscan: 0.4 mg  Dose of Atropine (mg): n/a Dose of Dobutamine: n/a mcg/kg/min (at max HR)  Stress Test Technologist: Milana Na, EMT-P  Nuclear Technologist:  Domenic Polite, CNMT     Rest Procedure:  Myocardial perfusion imaging was performed at rest 45 minutes following the intravenous administration of Technetium 51m  Sestamibi. Rest ECG: Sinus Bradycardia  Stress Procedure:  The patient received IV Lexiscan 0.4 mg over 15-seconds.  Technetium 72m Sestamibi injected at 30-seconds.  There were no significant changes, + sob, nausea, and headache with Lexiscan.  Quantitative spect images were obtained after a 45 minute delay. Stress ECG: No significant change from baseline ECG  QPS Raw Data Images:  Normal; no motion artifact; normal heart/lung ratio. Stress Images:  Normal homogeneous uptake in all areas of the myocardium. Rest Images:  Normal homogeneous uptake in all areas of the myocardium. Subtraction (SDS):  There is no evidence of scar or ischemia. Transient Ischemic Dilatation (Normal <1.22):  0.94 Lung/Heart Ratio (Normal <0.45):  0.27  Quantitative Gated Spect Images QGS EDV:  103 ml QGS ESV:  43 ml  Impression Exercise Capacity:  Lexiscan with low level exercise.  BP Response:  Hypertensive at baseline, no change with infusion.  Clinical Symptoms:  Shortness of breath, nausea.  ECG Impression:  No significant ST segment change suggestive of ischemia. Comparison with Prior Nuclear Study: No images to compare  Overall Impression:  Normal stress nuclear study.  LV Ejection Fraction: 58%.  LV Wall Motion:  NL LV Function; NL Wall Motion  Marca Ancona 04/07/2012

## 2012-05-07 ENCOUNTER — Emergency Department (INDEPENDENT_AMBULATORY_CARE_PROVIDER_SITE_OTHER): Payer: 59

## 2012-05-07 ENCOUNTER — Emergency Department (INDEPENDENT_AMBULATORY_CARE_PROVIDER_SITE_OTHER)
Admission: EM | Admit: 2012-05-07 | Discharge: 2012-05-07 | Disposition: A | Discharge status: Home / Self Care | Payer: 59 | Source: Home / Self Care

## 2012-05-07 ENCOUNTER — Encounter: Payer: Self-pay | Admitting: Emergency Medicine

## 2012-05-07 DIAGNOSIS — S82899A Other fracture of unspecified lower leg, initial encounter for closed fracture: Secondary | ICD-10-CM

## 2012-05-07 DIAGNOSIS — M25579 Pain in unspecified ankle and joints of unspecified foot: Secondary | ICD-10-CM

## 2012-05-07 DIAGNOSIS — X500XXA Overexertion from strenuous movement or load, initial encounter: Secondary | ICD-10-CM

## 2012-05-07 DIAGNOSIS — M25572 Pain in left ankle and joints of left foot: Secondary | ICD-10-CM

## 2012-05-07 DIAGNOSIS — S8263XA Displaced fracture of lateral malleolus of unspecified fibula, initial encounter for closed fracture: Secondary | ICD-10-CM

## 2012-05-07 NOTE — ED Notes (Signed)
Left ankle injury x 3 days ago, stepped of a step and twisted left ankle

## 2012-05-07 NOTE — ED Provider Notes (Signed)
History     CSN: [id number]  Arrival date & time 05/07/12  1033   None     Chief Complaint  Patient presents with  . Ankle Injury   HPI Comments: Pt missed last step on stairs and rolled ankle 3 days ago.  Has had persistent lateral ankle pain since this point. Mild swelling.  Has been able to bear weight mildly, albeit with pain.   Patient is a 46 y.o. female presenting with lower extremity injury.  Ankle Injury This is a new problem. Episode onset: 3 days ago. The problem occurs constantly. The problem has not changed since onset.The symptoms are aggravated by walking and standing (weight bearing). The symptoms are relieved by ice and rest.    Past Medical History  Diagnosis Date  . Right renal atrophy   . Thyroid disease   . Renal calculi   . SOB (shortness of breath)   . Chest pain   . Heart palpitations     Past Surgical History  Procedure Date  . Cesarean section   . Kidney stone surgery   . Cholecystectomy   . Laparoscopic nephrectomy     Family History  Problem Relation Age of Onset  . Heart attack      on mothers side of the family   . Stroke      on mothers side of the family     History  Substance Use Topics  . Smoking status: Current Every Day Smoker -- 0.5 packs/day for 25 years    Types: Cigarettes  . Smokeless tobacco: Not on file  . Alcohol Use: No    OB History    Grav Para Term Preterm Abortions TAB SAB Ect Mult Living                  Review of Systems  All other systems reviewed and are negative.    Allergies  Demerol  Home Medications   Current Outpatient Rx  Name  Route  Sig  Dispense  Refill  . ALPRAZOLAM 1 MG PO TABS   Oral   Take 1 mg by mouth 3 (three) times daily as needed. anxiety         . ASPIRIN 325 MG PO TABS   Oral   Take 325 mg by mouth once.         Marland Kitchen HYDROCODONE-ACETAMINOPHEN 10-325 MG PO TABS   Oral   Take 1 tablet by mouth every 6 (six) hours as needed.         Marland Kitchen LEVOTHYROXINE SODIUM 150  MCG PO TABS   Oral   Take 150 mcg by mouth daily.           BP 137/88  Pulse 71  Temp 98 F (36.7 C) (Oral)  Resp 18  Ht 5\' 2"  (1.575 m)  Wt 218 lb (98.884 kg)  BMI 39.87 kg/m2  SpO2 98%  LMP 04/21/2012  Physical Exam  Vitals reviewed. Constitutional:       Obese    HENT:  Head: Normocephalic and atraumatic.  Eyes: Conjunctivae normal are normal. Pupils are equal, round, and reactive to light.  Neck: Normal range of motion. Neck supple.  Cardiovascular: Normal rate and regular rhythm.   Pulmonary/Chest: Effort normal and breath sounds normal.  Abdominal: Soft.  Musculoskeletal:       Feet:  Neurological: She is alert.    ED Course  Procedures (including critical care time)  Labs Reviewed - No data to display Dg Ankle Complete  Left  05/07/2012  *RADIOLOGY REPORT*  Clinical Data: Twisting injury 2 days ago.  LEFT ANKLE COMPLETE - 3+ VIEW  Comparison: None.  Findings: The patient has a tiny avulsion fracture off the lateral malleolus with associated soft tissue swelling.  No other acute bony or joint abnormality is identified.  No tibiotalar joint effusion is seen.  IMPRESSION: Tiny avulsion fracture lateral malleolus with associated soft tissue swelling.   Original Report Authenticated By: Holley Dexter, M.D.      1. Ankle pain, left   2. Avulsion fracture of lateral malleolus       MDM  Fracture below the level of the ankle mortise.  Will place in cam walker RICE and NSAIDs.  Plan for follow up with sports medicine.  Otherwise follow up as needed.     The patient and/or caregiver has been counseled thoroughly with regard to treatment plan and/or medications prescribed including dosage, schedule, interactions, rationale for use, and possible side effects and they verbalize understanding. Diagnoses and expected course of recovery discussed and will return if not improved as expected or if the condition worsens. Patient and/or caregiver verbalized  understanding.             Doree Albee, MD 05/07/12 [phone]

## 2013-03-26 ENCOUNTER — Encounter: Payer: Self-pay | Admitting: Emergency Medicine

## 2013-03-26 ENCOUNTER — Emergency Department (INDEPENDENT_AMBULATORY_CARE_PROVIDER_SITE_OTHER)
Admission: EM | Admit: 2013-03-26 | Discharge: 2013-03-26 | Disposition: A | Discharge status: Home / Self Care | Payer: 59 | Source: Home / Self Care | Attending: Family Medicine | Admitting: Family Medicine

## 2013-03-26 ENCOUNTER — Emergency Department (INDEPENDENT_AMBULATORY_CARE_PROVIDER_SITE_OTHER): Payer: 59

## 2013-03-26 DIAGNOSIS — R51 Headache: Secondary | ICD-10-CM

## 2013-03-26 MED ORDER — PROPRANOLOL HCL ER 60 MG PO CP24: ORAL_CAPSULE | ORAL | Status: AC

## 2013-03-26 MED ORDER — BUTALBITAL-APAP-CAFFEINE 50-325-40 MG PO TABS: ORAL_TABLET | ORAL | Status: DC

## 2013-03-26 NOTE — ED Provider Notes (Signed)
CSN: [id number]     Arrival date & time 03/26/13  7829 History   First MD Initiated Contact with Patient 03/26/13 [phone]     Chief Complaint  Patient presents with  . Headache      HPI Comments: Patient complains of a generally constant right sided headache for about two months.  The headache is located behind her right eye.  She usually awakens with the headache.  She sometimes has nausea but no vomiting.  She has a longer history of a second type of headache that occurs in her frontal area about every one to two months.  She sometimes has an aura of "tunnel vision," or rippling lights, or flashing light.  Hydrocodone sometimes is helpful.  She notes that she has had a nontender nodule behind her right ear that has gradually increased in size.  She feels well otherwise.  No fevers, chills, and sweats  There is no family history of headaches.  Patient is a 47 y.o. female presenting with headaches. The history is provided by the patient.  Headache Pain location:  R parietal Quality:  Dull Radiates to:  Does not radiate Severity at highest:  7/10 Onset quality:  Gradual Duration:  8 weeks Timing:  Intermittent Progression:  Waxing and waning Chronicity:  New Similar to prior headaches: no   Relieved by: hydrocodone. Worsened by:  Activity Ineffective treatments:  Acetaminophen Associated symptoms: eye pain, nausea, neck stiffness and visual change   Associated symptoms: no abdominal pain, no blurred vision, no congestion, no cough, no dizziness, no drainage, no ear pain, no facial pain, no fatigue, no fever, no focal weakness, no hearing loss, no loss of balance, no near-syncope, no neck pain, no numbness, no paresthesias, no photophobia, no sinus pressure, no sore throat, no swollen glands, no tingling and no weakness     Past Medical History  Diagnosis Date  . Right renal atrophy   . Thyroid disease   . Renal calculi   . SOB (shortness of breath)   . Chest pain   . Heart  palpitations    Past Surgical History  Procedure Laterality Date  . Cesarean section    . Kidney stone surgery    . Cholecystectomy    . Laparoscopic nephrectomy     Family History  Problem Relation Age of Onset  . Heart attack      on mothers side of the family   . Stroke      on mothers side of the family   . Thyroid disease Mother    History  Substance Use Topics  . Smoking status: Current Every Day Smoker -- 0.50 packs/day for 25 years    Types: Cigarettes  . Smokeless tobacco: Not on file  . Alcohol Use: No   OB History   Grav Para Term Preterm Abortions TAB SAB Ect Mult Living                 Review of Systems  Constitutional: Negative for fever and fatigue.  HENT: Negative for congestion, ear pain, hearing loss, postnasal drip, sinus pressure and sore throat.   Eyes: Positive for pain. Negative for blurred vision and photophobia.  Respiratory: Negative for cough.   Cardiovascular: Negative for near-syncope.  Gastrointestinal: Positive for nausea. Negative for abdominal pain.  Musculoskeletal: Positive for neck stiffness. Negative for neck pain.  Neurological: Positive for headaches. Negative for dizziness, focal weakness, numbness, paresthesias and loss of balance.    Allergies  Demerol  Home Medications  Current Outpatient Rx  Name  Route  Sig  Dispense  Refill  . ALPRAZolam (XANAX) 1 MG tablet   Oral   Take 1 mg by mouth 3 (three) times daily as needed. anxiety         . aspirin 325 MG tablet   Oral   Take 325 mg by mouth once.         . butalbital-acetaminophen-caffeine (FIORICET) 50-325-40 MG per tablet      Take one tab by mouth twice daily prn headache.  Do not take more than two days.   10 tablet   0   . HYDROcodone-acetaminophen (NORCO) 10-325 MG per tablet   Oral   Take 1 tablet by mouth every 6 (six) hours as needed.         Marland Kitchen levothyroxine (SYNTHROID, LEVOTHROID) 150 MCG tablet   Oral   Take 150 mcg by mouth daily.          . propranolol ER (INDERAL LA) 60 MG 24 hr capsule      Take one tab by mouth at bedtime   15 capsule   1    BP 132/82  Pulse 55  Temp(Src) 97.8 F (36.6 C) (Oral)  Ht 5\' 2"  (1.575 m)  Wt 201 lb (91.173 kg)  BMI 36.75 kg/m2  SpO2 100%  LMP 03/12/2013 Physical Exam Nursing notes and Vital Signs reviewed. Appearance:  Patient appears stated age, and in no acute distress.  Patient is obese (BMI 36.8) Eyes:  Pupils are equal, round, and reactive to light and accomodation.  Extraocular movement is intact.  Conjunctivae are not inflamed.  Fundi benign.  No photophobia.  Head:  Tenderness right temple but no definite tenderness over temporal artery.  Mild tenderness occipital area. Ears:  Canals normal.  Tympanic membranes normal.  No TMJ tenderness Nose:  Mildly congested turbinates.  No sinus tenderness.   Pharynx:  Normal Neck:  Supple.  No adenopathy.  Carotids normal. Lungs:  Clear to auscultation.  Breath sounds are equal.  Heart:  Regular rate and rhythm without murmurs, rubs, or gallops.  Abdomen:  Nontender without masses or hepatosplenomegaly.  Bowel sounds are present.  No CVA or flank tenderness.  Extremities:  No edema.  No calf tenderness Skin:  No rash present. Neurologic:  Cranial nerves 2 through 12 are normal.  Patellar, achilles, and elbow reflexes are normal.  Cerebellar function is intact (finger-to-nose and rapid alternating hand movement).  Gait and station are normal.      ED Course  Procedures  none    Labs Reviewed  SEDIMENTATION RATE   Imaging Review Ct Head Wo Contrast  03/26/2013   CLINICAL DATA:  Headache.  EXAM: CT HEAD WITHOUT CONTRAST  TECHNIQUE: Contiguous axial images were obtained from the base of the skull through the vertex without intravenous contrast.  COMPARISON:  CT scan of February 25, 2011.  FINDINGS: Bony calvarium is intact. No mass effect or midline shift is noted. Ventricular size is within normal limits. There is no evidence of mass  lesion, hemorrhage or acute infarction.  IMPRESSION: No gross intracranial abnormality seen.   Electronically Signed   By: Roque Lias M.D.   On: 03/26/2013 10:47      MDM   1. Headache(784.0); suspect mixed vascular headache    Sed rate pending Begin trial of Inderal LA60 one at bedtime.  Rx for Fioricet, one BID prn (take no more than two days) Followup with Family Doctor in one week  Lattie Haw, MD 03/26/13 [phone]

## 2013-03-26 NOTE — ED Notes (Signed)
Headache behind Rt eye, woke up with it. Pt C/O of knot behind rt ear which seems to be getting larger, it's been there for about 8 months, wakes up with a headache x 2 months, always on the rt side, behind rt eye

## 2013-03-27 LAB — SEDIMENTATION RATE: Sed Rate: 1 mm/hr (ref 0–22)

## 2013-03-29 ENCOUNTER — Telehealth: Payer: Self-pay | Admitting: Emergency Medicine

## 2013-05-03 ENCOUNTER — Emergency Department (INDEPENDENT_AMBULATORY_CARE_PROVIDER_SITE_OTHER)
Admission: EM | Admit: 2013-05-03 | Discharge: 2013-05-03 | Disposition: A | Discharge status: Home / Self Care | Payer: 59 | Source: Home / Self Care | Attending: Family Medicine | Admitting: Family Medicine

## 2013-05-03 ENCOUNTER — Encounter: Payer: Self-pay | Admitting: Emergency Medicine

## 2013-05-03 DIAGNOSIS — R109 Unspecified abdominal pain: Secondary | ICD-10-CM

## 2013-05-03 LAB — POCT URINALYSIS DIP (MANUAL ENTRY)
Bilirubin, UA: NEGATIVE
Glucose, UA: NEGATIVE
Nitrite, UA: NEGATIVE
Spec Grav, UA: 1.025 (ref 1.005–1.03)
Urobilinogen, UA: 0.2 (ref 0–1)

## 2013-05-03 MED ORDER — CIPROFLOXACIN HCL 250 MG PO TABS: 250.0000 mg | ORAL_TABLET | Freq: Two times a day (BID) | ORAL | Status: DC

## 2013-05-03 NOTE — ED Notes (Signed)
Jennifer Benson c/o left flank pain x this AM without injury. 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 She has only the left kidney. Denies any urinary symptoms, fever or chills.

## 2013-05-03 NOTE — ED Provider Notes (Signed)
CSN: [id number]     Arrival date & time 05/03/13  1607 History   First MD Initiated Contact with Patient 05/03/13 1642     Chief Complaint  Patient presents with  . Flank Pain      HPI Comments: Patient complains of onset of non-radiating left lower back pain this morning, without urinary symptoms.  She has felt fatigued for two days.  She recalls no back injury.  No abdominal pain.  No fevers, chills, and sweats. She has a past history of frequent UTI's, and one functioning kidney (left).  She states that a recent test of renal function was normal.  Patient is a 47 y.o. female presenting with flank pain. The history is provided by the patient.  Flank Pain This is a new problem. The current episode started 6 to 12 hours ago. The problem occurs constantly. The problem has not changed since onset.Pertinent negatives include no abdominal pain. Nothing aggravates the symptoms. Nothing relieves the symptoms. She has tried nothing for the symptoms.    Past Medical History  Diagnosis Date  . Right renal atrophy   . Thyroid disease   . Renal calculi   . SOB (shortness of breath)   . Chest pain   . Heart palpitations    Past Surgical History  Procedure Laterality Date  . Cesarean section    . Kidney stone surgery    . Cholecystectomy    . Laparoscopic nephrectomy    . Nephrectomy Right    Family History  Problem Relation Age of Onset  . Heart attack      on mothers side of the family   . Stroke      on mothers side of the family   . Thyroid disease Mother    History  Substance Use Topics  . Smoking status: Current Every Day Smoker -- 0.50 packs/day for 25 years    Types: Cigarettes  . Smokeless tobacco: Never Used  . Alcohol Use: No   OB History   Grav Para Term Preterm Abortions TAB SAB Ect Mult Living                 Review of Systems  Gastrointestinal: Negative for abdominal pain.  Genitourinary: Positive for flank pain.  All other systems reviewed and are  negative.    Allergies  Demerol  Home Medications   Current Outpatient Rx  Name  Route  Sig  Dispense  Refill  . ALPRAZolam (XANAX) 1 MG tablet   Oral   Take 1 mg by mouth 3 (three) times daily as needed. anxiety         . aspirin 325 MG tablet   Oral   Take 325 mg by mouth once.         Marland Kitchen HYDROcodone-acetaminophen (NORCO) 10-325 MG per tablet   Oral   Take 1 tablet by mouth every 6 (six) hours as needed.         Marland Kitchen levothyroxine (SYNTHROID, LEVOTHROID) 150 MCG tablet   Oral   Take 150 mcg by mouth daily.         . propranolol ER (INDERAL LA) 60 MG 24 hr capsule      Take one tab by mouth at bedtime   15 capsule   1    BP 126/87  Pulse 70  Temp(Src) 98 F (36.7 C) (Oral)  Resp 14  Wt 201 lb (91.173 kg)  SpO2 100%  LMP 04/26/2013 Physical Exam Nursing notes and Vital Signs reviewed. Appearance:  Patient appears stated age, and in no acute distress.  Patient is obese. Eyes:  Pupils are equal, round, and reactive to light and accomodation.  Extraocular movement is intact.  Conjunctivae are not inflamed   Pharynx:  Normal; moist mucous membranes  Neck:  Supple.   No adenopathy Lungs:  Clear to auscultation.  Breath sounds are equal.  Heart:  Regular rate and rhythm without murmurs, rubs, or gallops.  Abdomen:  Nontender without masses or hepatosplenomegaly.  Bowel sounds are present.  Mild left CVA and flank tenderness present.  Extremities:  No edema.  No calf tenderness Skin:  No rash present.   ED Course  Procedures     Labs Reviewed  POCT URINALYSIS DIP (MANUAL ENTRY):  BLO trace-intact, otherwise negative        MDM   1. Flank pain    Urine culture pending.  Begin Cipro. Followup with Family Doctor if not improved in one week.  Increase fluid intake. If symptoms become significantly worse during the night or over the weekend, proceed to the local emergency room.    Lattie Haw, MD 05/03/13 [phone]

## 2013-05-05 LAB — URINE CULTURE
Colony Count: NO GROWTH
Organism ID, Bacteria: NO GROWTH

## 2013-09-28 ENCOUNTER — Emergency Department (INDEPENDENT_AMBULATORY_CARE_PROVIDER_SITE_OTHER)
Admission: EM | Admit: 2013-09-28 | Discharge: 2013-09-28 | Disposition: A | Discharge status: Home / Self Care | Payer: 59 | Source: Home / Self Care | Attending: Emergency Medicine | Admitting: Emergency Medicine

## 2013-09-28 ENCOUNTER — Emergency Department: Payer: 59

## 2013-09-28 ENCOUNTER — Encounter: Payer: Self-pay | Admitting: Emergency Medicine

## 2013-09-28 DIAGNOSIS — M76899 Other specified enthesopathies of unspecified lower limb, excluding foot: Secondary | ICD-10-CM

## 2013-09-28 DIAGNOSIS — M79609 Pain in unspecified limb: Secondary | ICD-10-CM

## 2013-09-28 DIAGNOSIS — M79604 Pain in right leg: Secondary | ICD-10-CM

## 2013-09-28 DIAGNOSIS — M7120 Synovial cyst of popliteal space [Baker], unspecified knee: Secondary | ICD-10-CM

## 2013-09-28 DIAGNOSIS — M7051 Other bursitis of knee, right knee: Secondary | ICD-10-CM

## 2013-09-28 MED ORDER — TRAMADOL-ACETAMINOPHEN 37.5-325 MG PO TABS: ORAL_TABLET | ORAL | Status: AC

## 2013-09-28 NOTE — ED Provider Notes (Signed)
CSN: [id number]     Arrival date & time 09/28/13  1407 History   None    Chief Complaint  Patient presents with  . Leg Pain    Patient is a 48 y.o. female presenting with leg pain. The history is provided by the patient.  Leg Pain  Recalls no injury. Complains of 4 days of worsening right calf and popliteal pain. It's aching and sharp and has become progressively severe, now 7/10 in intensity. Tender to touch. Pain exacerbated of right lower extremity. Associated with calf Swelling. Denies any recent immobilization. Denies any anterior right knee pain or any anterior pain of right lower extremity. Has minimal leg numbness right lower extremity but no other focal neurologic symptoms. Denies frank weakness. She has a history of sciatica, but she states this feels different from her sciatica. She tried taking one hydrocodone a few hours ago (which was previously prescribed for sciatica ), but no improvement on that. Denies any associated chest pain or shortness of breath or nausea or vomiting. PCP is Dr. Marveen Reeks.  Past Medical History  Diagnosis Date  . Right renal atrophy   . Thyroid disease   . Renal calculi   . SOB (shortness of breath)   . Chest pain   . Heart palpitations    Past Surgical History  Procedure Laterality Date  . Cesarean section    . Kidney stone surgery    . Cholecystectomy    . Laparoscopic nephrectomy    . Nephrectomy Right    Family History  Problem Relation Age of Onset  . Heart attack      on mothers side of the family   . Stroke      on mothers side of the family   . Thyroid disease Mother    History  Substance Use Topics  . Smoking status: Current Every Day Smoker -- 0.50 packs/day for 25 years    Types: Cigarettes  . Smokeless tobacco: Never Used  . Alcohol Use: No   OB History   Grav Para Term Preterm Abortions TAB SAB Ect Mult Living                 Review of Systems  All other systems reviewed and are negative.   Allergies    Demerol Note, Demerol is listed as drug allergy, but she has taken hydrocodone in the past without side effects.  Home Medications   Prior to Admission medications   Medication Sig Start Date End Date Taking? Authorizing Provider  ALPRAZolam Duanne Moron) 1 MG tablet Take 1 mg by mouth 3 (three) times daily as needed. anxiety    Historical Provider, MD  aspirin 325 MG tablet Take 325 mg by mouth once.    Historical Provider, MD  ciprofloxacin (CIPRO) 250 MG tablet Take 1 tablet (250 mg total) by mouth 2 (two) times daily. 05/03/13   Kandra Nicolas, MD  HYDROcodone-acetaminophen (NORCO) 10-325 MG per tablet Take 1 tablet by mouth every 6 (six) hours as needed.    Historical Provider, MD  levothyroxine (SYNTHROID, LEVOTHROID) 150 MCG tablet Take 150 mcg by mouth daily.    Historical Provider, MD  propranolol ER (INDERAL LA) 60 MG 24 hr capsule Take one tab by mouth at bedtime 03/26/13   Kandra Nicolas, MD   BP 131/78  Pulse 63  Temp(Src) 98.1 F (36.7 C) (Oral)  Resp 14  SpO2 97%  LMP 09/20/2013 Physical Exam  Nursing note and vitals reviewed. Constitutional: She is oriented to  person, place, and time. She appears well-developed and well-nourished. No distress.  Very uncomfortable from right calf and popliteal pain. She uses a cane to ambulate  HENT:  Head: Normocephalic and atraumatic.  Eyes: Conjunctivae and EOM are normal. Pupils are equal, round, and reactive to light. No scleral icterus.  Neck: Normal range of motion.  Cardiovascular: Normal rate.   Pulmonary/Chest: Effort normal.  Abdominal: She exhibits no distension.  Musculoskeletal: Normal range of motion.       Right lower leg: She exhibits tenderness and swelling. She exhibits no bony tenderness.       Legs:  No anterior or bony tenderness of right lower extremity. Right lateral calf and popliteal area is swollen and indurated and exquisitely tender. No redness or heat or fluctuance or red streaks.  + HOMANS SIGN ON  RIGHT.  Neurovascular distally intact.  Neurological: She is alert and oriented to person, place, and time.  Skin: Skin is warm. No rash noted.  Psychiatric: She has a normal mood and affect.   Very tender, indurated, mildly warm, mildly swollen right calf, especially proximal medial calf and popliteal area. ED Course  Procedures (including critical care time) Labs Review Labs Reviewed - No data to display 2:23 PM Very tender, indurated. minimally warm. mildly swollen right calf, especially proximal medial calf and popliteal area. Positive Homans sign. --- This is highly suggestive of DVT. We'll need to rule out DVT, and we arranged for a stat ultrasound venous Doppler right leg.--Patient agrees with this plan.  Imaging Review US Venous Img Lower Unilateral Right  09/28/2013   CLINICAL DATA:  Posterior knee and calf pain.  EXAM: RIGHT LOWER EXTREMITY VENOUS DOPPLER ULTRASOUND  TECHNIQUE: Gray-scale sonography with compression, as well as color and duplex ultrasound, were performed to evaluate the deep venous system from the level of the common femoral vein through the popliteal and proximal calf veins.  COMPARISON:  None  FINDINGS: Normal compressibility of the common femoral, superficial femoral, and popliteal veins, as well as the proximal calf veins. No filling defects to suggest DVT on grayscale or color Doppler imaging. Doppler waveforms show normal direction of venous flow, normal respiratory phasicity and response to augmentation. There is a 23 x 12 x 13 mm complex cystic region in the posterior popliteal fossa containing echogenic debris, without any internal color flow signal. Visualized segments of the saphenous venous system normal in caliber and compressibility.  IMPRESSION: 1. No evidence of  lower extremity deep vein thrombosis. 2. Suspect complex small Baker's cyst.   Electronically Signed   By: Arne Cleveland M.D.   On: 09/28/2013 15:00     MDM   1. Right leg pain   2.  Bursitis of right knee   3. Baker's cyst of knee    Right lower extremity venous Doppler ultrasound: No evidence of DVT. 23x12x13 mm complex Baker's cyst in the posterior popliteal fossa. She has no bony tenderness of the knee, therefore x-ray of knee not indicated.  Treatment options discussed, as well as risks, benefits, alternatives. Patient voiced understanding and agreement with the following plans: Right knee immobilizer fitted, and that significantly decreased her pain. We discussed prescription pain medication options.--She states she's been told by other physicians to avoid NSAIDs because she has one kidney. Therefore, I advised OTC Tylenol for mild to moderate pain. For moderate to severe pain, I prescribed #20 Ultracet, but advised this is not for chronic use. Precautions discussed. Also, try heat and/or ice . Follow-up with your orthopedist  in 7 days if not improving, or sooner if symptoms become worse. Precautions discussed. Red flags discussed. Questions invited and answered. Patient voiced understanding and agreement.     Jacqulyn Cane, MD 09/28/13 [phone]

## 2013-09-28 NOTE — ED Notes (Signed)
Pt c/o right knee and leg pain x 4-5 days without injury.

## 2014-02-23 ENCOUNTER — Other Ambulatory Visit: Payer: Self-pay | Admitting: Unknown Physician Specialty

## 2014-02-23 DIAGNOSIS — Z139 Encounter for screening, unspecified: Secondary | ICD-10-CM

## 2014-03-02 ENCOUNTER — Ambulatory Visit (INDEPENDENT_AMBULATORY_CARE_PROVIDER_SITE_OTHER): Payer: 59

## 2014-03-02 DIAGNOSIS — Z1231 Encounter for screening mammogram for malignant neoplasm of breast: Secondary | ICD-10-CM

## 2014-03-02 DIAGNOSIS — Z139 Encounter for screening, unspecified: Secondary | ICD-10-CM

## 2014-12-28 ENCOUNTER — Emergency Department (HOSPITAL_COMMUNITY)
Admission: EM | Admit: 2014-12-28 | Discharge: 2014-12-28 | Disposition: A | Discharge status: Home / Self Care | Payer: 59 | Attending: Emergency Medicine | Admitting: Emergency Medicine

## 2014-12-28 ENCOUNTER — Encounter (HOSPITAL_COMMUNITY): Payer: Self-pay | Admitting: *Deleted

## 2014-12-28 ENCOUNTER — Emergency Department (HOSPITAL_BASED_OUTPATIENT_CLINIC_OR_DEPARTMENT_OTHER)
Admit: 2014-12-28 | Discharge: 2014-12-28 | Disposition: A | Discharge status: Home / Self Care | Payer: 59 | Attending: Emergency Medicine | Admitting: Emergency Medicine

## 2014-12-28 DIAGNOSIS — M545 Low back pain: Secondary | ICD-10-CM | POA: Diagnosis not present

## 2014-12-28 DIAGNOSIS — Z87442 Personal history of urinary calculi: Secondary | ICD-10-CM | POA: Diagnosis not present

## 2014-12-28 DIAGNOSIS — R29898 Other symptoms and signs involving the musculoskeletal system: Secondary | ICD-10-CM

## 2014-12-28 DIAGNOSIS — Z87448 Personal history of other diseases of urinary system: Secondary | ICD-10-CM | POA: Diagnosis not present

## 2014-12-28 DIAGNOSIS — M5412 Radiculopathy, cervical region: Secondary | ICD-10-CM | POA: Diagnosis not present

## 2014-12-28 DIAGNOSIS — Z7982 Long term (current) use of aspirin: Secondary | ICD-10-CM | POA: Insufficient documentation

## 2014-12-28 DIAGNOSIS — M79602 Pain in left arm: Secondary | ICD-10-CM

## 2014-12-28 DIAGNOSIS — E079 Disorder of thyroid, unspecified: Secondary | ICD-10-CM | POA: Diagnosis not present

## 2014-12-28 DIAGNOSIS — Z720 Tobacco use: Secondary | ICD-10-CM | POA: Insufficient documentation

## 2014-12-28 DIAGNOSIS — Z792 Long term (current) use of antibiotics: Secondary | ICD-10-CM | POA: Insufficient documentation

## 2014-12-28 DIAGNOSIS — Z79899 Other long term (current) drug therapy: Secondary | ICD-10-CM | POA: Insufficient documentation

## 2014-12-28 MED ORDER — OXYCODONE-ACETAMINOPHEN 5-325 MG PO TABS
2.0000 | ORAL_TABLET | Freq: Once | ORAL | Status: AC
  Administered 2014-12-28: 2 via ORAL
  Filled 2014-12-28: qty 2

## 2014-12-28 NOTE — ED Provider Notes (Signed)
CSN: [id number]     Arrival date & time 12/28/14  1135 History  This chart was scribed for non-physician practitioner Lucien Mons, PA-C working with Charlesetta Shanks, MD by Hilda Lias, ED Scribe. This patient was seen in room WTR8/WTR8 and the patient's care was started at 12:18 PM.    Chief Complaint  Patient presents with  . Arm Pain  . Back Pain      Patient is a 49 y.o. female presenting with arm pain and back pain. The history is provided by the patient. No language interpreter was used.  Arm Pain This is a new problem. The current episode started more than 1 week ago. The problem occurs constantly. The problem has been gradually worsening. Pertinent negatives include no chest pain, no abdominal pain, no headaches and no shortness of breath. The symptoms are aggravated by exertion.  Back Pain Associated symptoms: weakness (L arm)   Associated symptoms: no abdominal pain, no chest pain and no headaches    HPI Comments: Jennifer Benson is a 49 y.o. 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 Pt reports she is left hand dominant and is unable to hold things in her left hand on occasion without pain and weakness. Pt reports that she is taking hydrocodone 7.5 mg prescribed by her neurologist and Gabapentin for her neuropathy. Pt denies swelling and redness. No hx of blood clots. No injury or trauma. Denies neck pain, CP, SOB.    Past Medical History  Diagnosis Date  . Right renal atrophy   . Thyroid disease   . Renal calculi   . SOB (shortness of breath)   . Chest pain   . Heart palpitations    Past Surgical History  Procedure Laterality Date  . Cesarean section    . Kidney stone surgery    . Cholecystectomy    . Laparoscopic nephrectomy    . Nephrectomy Right    Family History  Problem Relation Age of Onset  . Heart  attack      on mothers side of the family   . Stroke      on mothers side of the family   . Thyroid disease Mother    Social History  Substance Use Topics  . Smoking status: Current Every Day Smoker -- 0.50 packs/day for 25 years    Types: Cigarettes  . Smokeless tobacco: Never Used  . Alcohol Use: No   OB History    No data available     Review of Systems  Respiratory: Negative for shortness of breath.   Cardiovascular: Negative for chest pain.  Gastrointestinal: Negative for abdominal pain.  Musculoskeletal: Positive for back pain.       + L arm pain/burning.  Neurological: Positive for weakness (L arm). Negative for headaches.  All other systems reviewed and are negative.  A complete 10 system review of systems was obtained and all systems are negative except as noted in the HPI and PMH.     Allergies  Demerol  Home Medications   Prior to Admission medications   Medication Sig Start Date End Date Taking? Authorizing Provider  ALPRAZolam Duanne Moron) 1 MG tablet Take 1 mg by mouth 3 (three) times daily as needed. anxiety    Historical Provider, MD  aspirin 325 MG tablet Take 325 mg by mouth once.    Historical Provider,  MD  ciprofloxacin (CIPRO) 250 MG tablet Take 1 tablet (250 mg total) by mouth 2 (two) times daily. 05/03/13   Kandra Nicolas, MD  HYDROcodone-acetaminophen (NORCO) 10-325 MG per tablet Take 1 tablet by mouth every 6 (six) hours as needed.    Historical Provider, MD  levothyroxine (SYNTHROID, LEVOTHROID) 150 MCG tablet Take 150 mcg by mouth daily.    Historical Provider, MD  propranolol ER (INDERAL LA) 60 MG 24 hr capsule Take one tab by mouth at bedtime 03/26/13   Kandra Nicolas, MD  traMADol-acetaminophen (ULTRACET) 37.5-325 MG per tablet 1 or 2 every 4-6 hours as needed for moderate-severe pain.  Caution: May cause drowsiness 09/28/13   Jacqulyn Cane, MD   BP 137/80 mmHg  Pulse 71  Temp(Src) 97.9 F (36.6 C) (Oral)  Resp 18  SpO2 95%  LMP  06/13/2014 Physical Exam  Constitutional: She is oriented to person, place, and time. She appears well-developed and well-nourished. No distress.  HENT:  Head: Normocephalic and atraumatic.  Mouth/Throat: Oropharynx is clear and moist.  Eyes: Conjunctivae and EOM are normal. Pupils are equal, round, and reactive to light.  Neck: Normal range of motion. Neck supple. No JVD present.  Cardiovascular: Normal rate, regular rhythm, normal heart sounds and intact distal pulses.   No extremity edema.  Pulmonary/Chest: Effort normal and breath sounds normal. No respiratory distress.  Abdominal: Soft. Bowel sounds are normal. There is no tenderness.  Musculoskeletal: Normal range of motion. She exhibits no edema.  Left shoulder and arm nontender. No obvious swelling or deformity. Exam somewhat limited by body habitus. Full shoulder, elbow and wrist range of motion. No palpable cords. No erythema or warmth.No tenderness to cervical spine or paraspinal muscles. Full cervical range of motion. +2 radial pulse.  Neurological: She is alert and oriented to person, place, and time. She has normal strength. No sensory deficit.  Speech fluent, goal oriented. Moves extremities without ataxia. Strength UE 5/5 and equal BL.  Skin: Skin is warm and dry. She is not diaphoretic.  Psychiatric: She has a normal mood and affect. Her behavior is normal.  Nursing note and vitals reviewed.   ED Course  Procedures (including critical care time)  DIAGNOSTIC STUDIES: Oxygen Saturation is 95% on room air, normal by my interpretation.    COORDINATION OF CARE: 12:25 PM Discussed treatment plan with pt at bedside and pt agreed to plan.   Labs Review Labs Reviewed - No data to display  Imaging Review No results found.    EKG Interpretation None      MDM   Final diagnoses:  Cervical radiculopathy  Left arm pain   Nontoxic appearing, NAD. AF VSS. Symptoms consistent with cervical radiculopathy, however  given the associated stated pain intensity, upper extremity venous duplex obtained to rule out DVT and is negative. Neurovascularly intact. No focal neurologic deficits. No associated chest pain or shortness of breath. Doubt cardiac. Doubt CVA.Advised follow-up with her neurologist for possible MRI to valuate cervical radiculopathy. Advised her to continue taking the prescribed hydrocodone 7.5 mg that she has at home. Stable for discharge. Return precautions given. Patient states understanding of treatment care plan and is agreeable.  I personally performed the services described in this documentation, which was scribed in my presence. The recorded information has been reviewed and is accurate.  Carman Ching, PA-C 12/28/14 [place], MD 12/29/14 [phone]

## 2014-12-28 NOTE — ED Notes (Addendum)
Pt complains of left arm pain and weakness for the past 10 days. Pt states her arm has become progressively painful from her shoulder down to her wrist. Pt states she had a tick bite 4 months ago in her left shoulder. Pt denies numbness, has equal hand grip, no arm drift bilaterally.

## 2014-12-28 NOTE — Progress Notes (Signed)
*  PRELIMINARY RESULTS* Vascular Ultrasound Left upper extremity venous duplex has been completed.  Preliminary findings: negative for DVT and SVT  Landry Mellow, RDMS, RVT  12/28/2014, 2:35 PM

## 2014-12-28 NOTE — Discharge Instructions (Signed)
Follow-up with your primary care physician and neurologist.I would discuss the possibility of getting an MRI for evaluation of cervical radiculopathy.  Cervical Radiculopathy Cervical radiculopathy happens when a nerve in the neck is pinched or bruised by a slipped (herniated) disk or by arthritic changes in the bones of the cervical spine. This can occur due to an injury or as part of the normal aging process. Pressure on the cervical nerves can cause pain or numbness that runs from your neck all the way down into your arm and fingers. CAUSES  There are many possible causes, including:  Injury.  Muscle tightness in the neck from overuse.  Swollen, painful joints (arthritis).  Breakdown or degeneration in the bones and joints of the spine (spondylosis) due to aging.  Bone spurs that may develop near the cervical nerves. SYMPTOMS  Symptoms include pain, weakness, or numbness in the affected arm and hand. Pain can be severe or irritating. Symptoms may be worse when extending or turning the neck. DIAGNOSIS  Your caregiver will ask about your symptoms and do a physical exam. He or she may test your strength and reflexes. X-rays, CT scans, and MRI scans may be needed in cases of injury or if the symptoms do not go away after a period of time. Electromyography (EMG) or nerve conduction testing may be done to study how your nerves and muscles are working. TREATMENT  Your caregiver may recommend certain exercises to help relieve your symptoms. Cervical radiculopathy can, and often does, get better with time and treatment. If your problems continue, treatment options may include:  Wearing a soft collar for short periods of time.  Physical therapy to strengthen the neck muscles.  Medicines, such as nonsteroidal anti-inflammatory drugs (NSAIDs), oral corticosteroids, or spinal injections.  Surgery. Different types of surgery may be done depending on the cause of your problems. HOME CARE  INSTRUCTIONS   Put ice on the affected area.  Put ice in a plastic bag.  Place a towel between your skin and the bag.  Leave the ice on for 15-20 minutes, 03-04 times a day or as directed by your caregiver.  If ice does not help, you can try using heat. Take a warm shower or bath, or use a hot water bottle as directed by your caregiver.  You may try a gentle neck and shoulder massage.  Use a flat pillow when you sleep.  Only take over-the-counter or prescription medicines for pain, discomfort, or fever as directed by your caregiver.  If physical therapy was prescribed, follow your caregiver's directions.  If a soft collar was prescribed, use it as directed. SEEK IMMEDIATE MEDICAL CARE IF:   Your pain gets much worse and cannot be controlled with medicines.  You have weakness or numbness in your hand, arm, face, or leg.  You have a high fever or a stiff, rigid neck.  You lose bowel or bladder control (incontinence).  You have trouble with walking, balance, or speaking. MAKE SURE YOU:   Understand these instructions.  Will watch your condition.  Will get help right away if you are not doing well or get worse. Document Released: 01/22/2001 Document Revised: 07/22/2011 Document Reviewed: 12/11/2010 [facility] Patient Information 2015 [place], [place]. This information is not intended to replace advice given to you by your health care provider. Make sure you discuss any questions you have with your health care provider.

## 2015-01-06 DIAGNOSIS — R8271 Bacteriuria: Secondary | ICD-10-CM | POA: Insufficient documentation

## 2015-01-06 DIAGNOSIS — J441 Chronic obstructive pulmonary disease with (acute) exacerbation: Secondary | ICD-10-CM | POA: Insufficient documentation

## 2015-01-06 DIAGNOSIS — J9601 Acute respiratory failure with hypoxia: Secondary | ICD-10-CM | POA: Insufficient documentation

## 2015-01-06 DIAGNOSIS — M5412 Radiculopathy, cervical region: Secondary | ICD-10-CM | POA: Insufficient documentation

## 2015-07-21 ENCOUNTER — Other Ambulatory Visit: Payer: Self-pay | Admitting: Unknown Physician Specialty

## 2015-07-21 DIAGNOSIS — Z1231 Encounter for screening mammogram for malignant neoplasm of breast: Secondary | ICD-10-CM

## 2015-07-21 DIAGNOSIS — Z780 Asymptomatic menopausal state: Secondary | ICD-10-CM

## 2015-08-24 ENCOUNTER — Encounter: Payer: Self-pay | Admitting: Emergency Medicine

## 2015-08-24 ENCOUNTER — Emergency Department (INDEPENDENT_AMBULATORY_CARE_PROVIDER_SITE_OTHER)
Admission: EM | Admit: 2015-08-24 | Discharge: 2015-08-24 | Disposition: A | Discharge status: Home / Self Care | Payer: BLUE CROSS/BLUE SHIELD | Source: Home / Self Care | Attending: Family Medicine | Admitting: Family Medicine

## 2015-08-24 DIAGNOSIS — R05 Cough: Secondary | ICD-10-CM | POA: Diagnosis not present

## 2015-08-24 DIAGNOSIS — R053 Chronic cough: Secondary | ICD-10-CM

## 2015-08-24 DIAGNOSIS — F17200 Nicotine dependence, unspecified, uncomplicated: Secondary | ICD-10-CM

## 2015-08-24 DIAGNOSIS — J441 Chronic obstructive pulmonary disease with (acute) exacerbation: Secondary | ICD-10-CM | POA: Diagnosis not present

## 2015-08-24 DIAGNOSIS — Z720 Tobacco use: Secondary | ICD-10-CM

## 2015-08-24 HISTORY — DX: Other chronic pain: G89.29

## 2015-08-24 HISTORY — DX: Unspecified osteoarthritis, unspecified site: M19.90

## 2015-08-24 HISTORY — DX: Chronic obstructive pulmonary disease, unspecified: J44.9

## 2015-08-24 MED ORDER — BENZONATATE 100 MG PO CAPS: 100.0000 mg | ORAL_CAPSULE | Freq: Three times a day (TID) | ORAL | Status: DC

## 2015-08-24 MED ORDER — DOXYCYCLINE HYCLATE 100 MG PO CAPS: 100.0000 mg | ORAL_CAPSULE | Freq: Two times a day (BID) | ORAL | Status: DC

## 2015-08-24 MED ORDER — ALBUTEROL SULFATE HFA 108 (90 BASE) MCG/ACT IN AERS: 1.0000 | INHALATION_SPRAY | Freq: Four times a day (QID) | RESPIRATORY_TRACT | Status: AC | PRN

## 2015-08-24 NOTE — ED Notes (Signed)
Dry cough x 6 weeks

## 2015-08-24 NOTE — Discharge Instructions (Signed)
You may take 400-600mg  Ibuprofen (Motrin) every 6-8 hours for fever and pain  Alternate with Tylenol  You may take 500mg  Tylenol every 4-6 hours as needed for fever and pain  Follow-up with your primary care provider next week for recheck of symptoms if not improving.  Be sure to drink plenty of fluids and rest, at least 8hrs of sleep a night, preferably more while you are sick. Return urgent care or go to closest ER if you cannot keep down fluids/signs of dehydration, fever not reducing with Tylenol, difficulty breathing/wheezing, stiff neck, worsening condition, or other concerns (see below)  Please take antibiotics as prescribed and be sure to complete entire course even if you start to feel better to ensure infection does not come back.  It is very important to take your medications exactly as prescribed so you get better faster, symptoms hopefully don't return, and to reduce chance of unwanted side effects. Continue to take your prednisone as prescribed. Please follow up with your primary care provider in 5-7 days if not improving, sooner if worsening.

## 2015-08-24 NOTE — ED Provider Notes (Signed)
CSN: [id number]     Arrival date & time 08/24/15  0830 History   First MD Initiated Contact with Patient 08/24/15 [phone]     Chief Complaint  Patient presents with  . Cough   (Consider location/radiation/quality/duration/timing/severity/associated sxs/prior Treatment) HPI  The pt is a 50yo female with extensive PMH including COPD, presenting to [facility] with c/o 6 weeks of intermittent dry to productive hacking cough.  She notes she was around a baby with RSV when symptoms initially started. She had a low-grade fever at that time along with producing thick yellow sputum.  Now it its just the cough, which is worse at night, causing her to gag sometimes when cough is most severe.  Denies current fever, n/v/d. She has been using her inhalers and has been taking leftover prednisone for 8 days from a prior hospitalization several months ago for breathing issues.  Pt is a still a daily smoker.  Past Medical History  Diagnosis Date  . Right renal atrophy   . Thyroid disease   . Renal calculi   . SOB (shortness of breath)   . Chest pain   . Heart palpitations   . COPD (chronic obstructive pulmonary disease) ([place])   . Osteoarthritis   . Chronic pain    Past Surgical History  Procedure Laterality Date  . Cesarean section    . Kidney stone surgery    . Cholecystectomy    . Laparoscopic nephrectomy    . Nephrectomy Right    Family History  Problem Relation Age of Onset  . Heart attack      on mothers side of the family   . Stroke      on mothers side of the family   . Thyroid disease Mother    Social History  Substance Use Topics  . Smoking status: Current Every Day Smoker -- 0.50 packs/day for 35 years    Types: Cigarettes  . Smokeless tobacco: Never Used  . Alcohol Use: No   OB History    No data available     Review of Systems  Constitutional: Negative for fever and chills.  HENT: Positive for congestion. Negative for ear pain, sore throat, trouble swallowing and voice change.    Respiratory: Positive for cough and wheezing. Negative for shortness of breath.   Cardiovascular: Negative for chest pain and palpitations.  Gastrointestinal: Negative for nausea, vomiting, abdominal pain and diarrhea.  Musculoskeletal: Negative for myalgias, back pain and arthralgias.  Skin: Negative for rash.    Allergies  Demerol  Home Medications   Prior to Admission medications   Medication Sig Start Date End Date Taking? Authorizing Provider  baclofen (LIORESAL) 10 MG tablet Take 10 mg by mouth 3 (three) times daily.   Yes Historical Provider, MD  cyanocobalamin 1000 MCG tablet Take 1,000 mcg by mouth daily.   Yes Historical Provider, MD  GABAPENTIN & LIDOCAINE-MENTHOL CO by Combination route.   Yes Historical Provider, MD  gabapentin (NEURONTIN) 300 MG capsule Take 300 mg by mouth 3 (three) times daily.   Yes Historical Provider, MD  meloxicam (MOBIC) 7.5 MG tablet Take 7.5 mg by mouth daily.   Yes Historical Provider, MD  oxycodone-acetaminophen (LYNOX) 7.5-300 MG tablet Take 1 tablet by mouth every 4 (four) hours as needed for pain.   Yes Historical Provider, MD  albuterol (PROVENTIL HFA;VENTOLIN HFA) 108 (90 Base) MCG/ACT inhaler Inhale 1-2 puffs into the lungs every 6 (six) hours as needed for wheezing or shortness of breath. 08/24/15   Junie Panning  Hilda Blades, PA-C  ALPRAZolam Duanne Moron) 1 MG tablet Take 1 mg by mouth 3 (three) times daily as needed. anxiety    Historical Provider, MD  aspirin 325 MG tablet Take 325 mg by mouth once.    Historical Provider, MD  benzonatate (TESSALON) 100 MG capsule Take 1 capsule (100 mg total) by mouth every 8 (eight) hours. 08/24/15   Noland Fordyce, PA-C  ciprofloxacin (CIPRO) 250 MG tablet Take 1 tablet (250 mg total) by mouth 2 (two) times daily. 05/03/13   Kandra Nicolas, MD  doxycycline (VIBRAMYCIN) 100 MG capsule Take 1 capsule (100 mg total) by mouth 2 (two) times daily. One po bid x 7 days 08/24/15   Noland Fordyce, PA-C  HYDROcodone-acetaminophen  [facility]) 10-325 MG per tablet Take 1 tablet by mouth every 6 (six) hours as needed.    Historical Provider, MD  levothyroxine (SYNTHROID, LEVOTHROID) 150 MCG tablet Take 150 mcg by mouth daily.    Historical Provider, MD  propranolol ER (INDERAL LA) 60 MG 24 hr capsule Take one tab by mouth at bedtime 03/26/13   Kandra Nicolas, MD  traMADol-acetaminophen (ULTRACET) 37.5-325 MG per tablet 1 or 2 every 4-6 hours as needed for moderate-severe pain.  Caution: May cause drowsiness 09/28/13   Jacqulyn Cane, MD   Meds Ordered and Administered this Visit  Medications - No data to display  BP 119/80 mmHg  Pulse 84  Temp(Src) 97.6 F (36.4 C) (Oral)  Ht 5\' 2"  (1.575 m)  Wt 217 lb (98.431 kg)  BMI 39.68 kg/m2  SpO2 97%  LMP 08/24/2015 No data found.   Physical Exam  Constitutional: She appears well-developed and well-nourished. No distress.  HENT:  Head: Normocephalic and atraumatic.  Eyes: Conjunctivae are normal. No scleral icterus.  Neck: Normal range of motion. Neck supple.  Cardiovascular: Normal rate, regular rhythm and normal heart sounds.   Pulmonary/Chest: Effort normal. No respiratory distress. She has decreased breath sounds in the right lower field and the left lower field. She has no wheezes. She has no rales. She exhibits no tenderness.  Abdominal: Soft. She exhibits no distension. There is no tenderness.  Musculoskeletal: Normal range of motion.  Neurological: She is alert.  Skin: Skin is warm and dry. She is not diaphoretic.  Nursing note and vitals reviewed.   ED Course  Procedures (including critical care time)  Labs Review Labs Reviewed - No data to display  Imaging Review No results found.     MDM   1. COPD exacerbation (HCC)   2. Persistent cough for 3 weeks or longer   3. Current smoker    Pt c/o persistent cough for 6 weeks.  Decreased breath sounds in lower lung fields. O2 Sat 97% on RA  Attempted to get a CXR today, however, machine is not working  today.  Will treat empirically.  Rx: Doxycycline, tessalon, and refill of albuterol inhaler.  Encouraged to stop smoking.  Advised pt to use acetaminophen and ibuprofen as needed for fever and pain. Encouraged rest and fluids. F/u with PCP in 5-7 days if not improving, sooner if worsening. Pt verbalized understanding and agreement with tx plan.     Noland Fordyce, PA-C 08/24/15 [phone]

## 2016-07-22 DIAGNOSIS — G250 Essential tremor: Secondary | ICD-10-CM | POA: Diagnosis not present

## 2016-07-22 DIAGNOSIS — M5417 Radiculopathy, lumbosacral region: Secondary | ICD-10-CM | POA: Diagnosis not present

## 2016-07-22 DIAGNOSIS — F329 Major depressive disorder, single episode, unspecified: Secondary | ICD-10-CM | POA: Diagnosis not present

## 2016-07-22 DIAGNOSIS — G603 Idiopathic progressive neuropathy: Secondary | ICD-10-CM | POA: Diagnosis not present

## 2016-08-06 DIAGNOSIS — Z0000 Encounter for general adult medical examination without abnormal findings: Secondary | ICD-10-CM | POA: Diagnosis not present

## 2016-08-06 DIAGNOSIS — M5416 Radiculopathy, lumbar region: Secondary | ICD-10-CM | POA: Diagnosis not present

## 2016-08-06 DIAGNOSIS — E7800 Pure hypercholesterolemia, unspecified: Secondary | ICD-10-CM | POA: Diagnosis not present

## 2016-08-06 DIAGNOSIS — Z113 Encounter for screening for infections with a predominantly sexual mode of transmission: Secondary | ICD-10-CM | POA: Diagnosis not present

## 2016-08-06 DIAGNOSIS — R5383 Other fatigue: Secondary | ICD-10-CM | POA: Diagnosis not present

## 2016-08-06 DIAGNOSIS — R634 Abnormal weight loss: Secondary | ICD-10-CM | POA: Diagnosis not present

## 2016-08-06 DIAGNOSIS — N951 Menopausal and female climacteric states: Secondary | ICD-10-CM | POA: Diagnosis not present

## 2016-08-06 DIAGNOSIS — Z1231 Encounter for screening mammogram for malignant neoplasm of breast: Secondary | ICD-10-CM | POA: Diagnosis not present

## 2016-08-06 DIAGNOSIS — L989 Disorder of the skin and subcutaneous tissue, unspecified: Secondary | ICD-10-CM | POA: Diagnosis not present

## 2016-08-14 ENCOUNTER — Other Ambulatory Visit: Payer: Self-pay | Admitting: Unknown Physician Specialty

## 2016-08-14 DIAGNOSIS — Z780 Asymptomatic menopausal state: Secondary | ICD-10-CM

## 2016-08-14 DIAGNOSIS — Z1239 Encounter for other screening for malignant neoplasm of breast: Secondary | ICD-10-CM

## 2016-09-03 ENCOUNTER — Ambulatory Visit (HOSPITAL_COMMUNITY): Payer: BLUE CROSS/BLUE SHIELD | Admitting: Licensed Clinical Social Worker

## 2016-09-10 ENCOUNTER — Ambulatory Visit (INDEPENDENT_AMBULATORY_CARE_PROVIDER_SITE_OTHER): Payer: Medicare Other

## 2016-09-10 DIAGNOSIS — Z1239 Encounter for other screening for malignant neoplasm of breast: Secondary | ICD-10-CM

## 2016-09-10 DIAGNOSIS — Z1231 Encounter for screening mammogram for malignant neoplasm of breast: Secondary | ICD-10-CM

## 2016-09-10 DIAGNOSIS — Z780 Asymptomatic menopausal state: Secondary | ICD-10-CM

## 2016-10-02 DIAGNOSIS — L570 Actinic keratosis: Secondary | ICD-10-CM | POA: Diagnosis not present

## 2016-10-02 DIAGNOSIS — L821 Other seborrheic keratosis: Secondary | ICD-10-CM | POA: Diagnosis not present

## 2016-10-02 DIAGNOSIS — D485 Neoplasm of uncertain behavior of skin: Secondary | ICD-10-CM | POA: Diagnosis not present

## 2016-10-02 DIAGNOSIS — C44319 Basal cell carcinoma of skin of other parts of face: Secondary | ICD-10-CM | POA: Diagnosis not present

## 2016-10-11 ENCOUNTER — Ambulatory Visit (HOSPITAL_COMMUNITY): Payer: BLUE CROSS/BLUE SHIELD | Admitting: Licensed Clinical Social Worker

## 2017-01-14 DIAGNOSIS — F332 Major depressive disorder, recurrent severe without psychotic features: Secondary | ICD-10-CM | POA: Diagnosis not present

## 2017-01-29 DIAGNOSIS — F332 Major depressive disorder, recurrent severe without psychotic features: Secondary | ICD-10-CM | POA: Diagnosis not present

## 2017-02-10 DIAGNOSIS — E039 Hypothyroidism, unspecified: Secondary | ICD-10-CM | POA: Diagnosis not present

## 2017-02-10 DIAGNOSIS — E7800 Pure hypercholesterolemia, unspecified: Secondary | ICD-10-CM | POA: Diagnosis not present

## 2017-02-10 DIAGNOSIS — M5136 Other intervertebral disc degeneration, lumbar region: Secondary | ICD-10-CM | POA: Diagnosis not present

## 2017-02-10 DIAGNOSIS — Z23 Encounter for immunization: Secondary | ICD-10-CM | POA: Diagnosis not present

## 2017-02-10 DIAGNOSIS — R634 Abnormal weight loss: Secondary | ICD-10-CM | POA: Diagnosis not present

## 2017-02-25 DIAGNOSIS — F332 Major depressive disorder, recurrent severe without psychotic features: Secondary | ICD-10-CM | POA: Diagnosis not present

## 2017-03-24 DIAGNOSIS — F332 Major depressive disorder, recurrent severe without psychotic features: Secondary | ICD-10-CM | POA: Diagnosis not present

## 2017-04-14 DIAGNOSIS — F332 Major depressive disorder, recurrent severe without psychotic features: Secondary | ICD-10-CM | POA: Diagnosis not present

## 2017-04-23 DIAGNOSIS — C44319 Basal cell carcinoma of skin of other parts of face: Secondary | ICD-10-CM | POA: Diagnosis not present

## 2017-04-23 DIAGNOSIS — L570 Actinic keratosis: Secondary | ICD-10-CM | POA: Diagnosis not present

## 2017-05-20 DIAGNOSIS — F332 Major depressive disorder, recurrent severe without psychotic features: Secondary | ICD-10-CM | POA: Diagnosis not present

## 2017-06-25 DIAGNOSIS — F332 Major depressive disorder, recurrent severe without psychotic features: Secondary | ICD-10-CM | POA: Diagnosis not present

## 2017-07-29 DIAGNOSIS — F17210 Nicotine dependence, cigarettes, uncomplicated: Secondary | ICD-10-CM | POA: Diagnosis not present

## 2017-07-29 DIAGNOSIS — Z885 Allergy status to narcotic agent status: Secondary | ICD-10-CM | POA: Diagnosis not present

## 2017-07-29 DIAGNOSIS — Z79899 Other long term (current) drug therapy: Secondary | ICD-10-CM | POA: Diagnosis not present

## 2017-07-29 DIAGNOSIS — M1990 Unspecified osteoarthritis, unspecified site: Secondary | ICD-10-CM | POA: Diagnosis not present

## 2017-07-29 DIAGNOSIS — F419 Anxiety disorder, unspecified: Secondary | ICD-10-CM | POA: Diagnosis not present

## 2017-07-29 DIAGNOSIS — R0602 Shortness of breath: Secondary | ICD-10-CM | POA: Diagnosis not present

## 2017-07-29 DIAGNOSIS — E039 Hypothyroidism, unspecified: Secondary | ICD-10-CM | POA: Diagnosis not present

## 2017-07-29 DIAGNOSIS — R0789 Other chest pain: Secondary | ICD-10-CM | POA: Diagnosis not present

## 2017-07-29 DIAGNOSIS — G629 Polyneuropathy, unspecified: Secondary | ICD-10-CM | POA: Diagnosis not present

## 2017-07-29 DIAGNOSIS — R079 Chest pain, unspecified: Secondary | ICD-10-CM | POA: Diagnosis not present

## 2017-08-05 DIAGNOSIS — F332 Major depressive disorder, recurrent severe without psychotic features: Secondary | ICD-10-CM | POA: Diagnosis not present

## 2017-08-11 DIAGNOSIS — F332 Major depressive disorder, recurrent severe without psychotic features: Secondary | ICD-10-CM | POA: Diagnosis not present

## 2017-09-02 DIAGNOSIS — E538 Deficiency of other specified B group vitamins: Secondary | ICD-10-CM | POA: Diagnosis not present

## 2017-09-02 DIAGNOSIS — Z0000 Encounter for general adult medical examination without abnormal findings: Secondary | ICD-10-CM | POA: Diagnosis not present

## 2017-09-02 DIAGNOSIS — E7800 Pure hypercholesterolemia, unspecified: Secondary | ICD-10-CM | POA: Diagnosis not present

## 2017-09-02 DIAGNOSIS — R5383 Other fatigue: Secondary | ICD-10-CM | POA: Diagnosis not present

## 2017-09-02 DIAGNOSIS — E039 Hypothyroidism, unspecified: Secondary | ICD-10-CM | POA: Diagnosis not present

## 2017-09-02 DIAGNOSIS — Z1231 Encounter for screening mammogram for malignant neoplasm of breast: Secondary | ICD-10-CM | POA: Diagnosis not present

## 2017-09-02 DIAGNOSIS — E559 Vitamin D deficiency, unspecified: Secondary | ICD-10-CM | POA: Diagnosis not present

## 2017-09-23 DIAGNOSIS — F332 Major depressive disorder, recurrent severe without psychotic features: Secondary | ICD-10-CM | POA: Diagnosis not present

## 2017-10-24 ENCOUNTER — Emergency Department (INDEPENDENT_AMBULATORY_CARE_PROVIDER_SITE_OTHER)
Admission: EM | Admit: 2017-10-24 | Discharge: 2017-10-24 | Disposition: A | Discharge status: Home / Self Care | Payer: Medicare Other | Source: Home / Self Care | Attending: Family Medicine | Admitting: Family Medicine

## 2017-10-24 ENCOUNTER — Encounter: Payer: Self-pay | Admitting: Emergency Medicine

## 2017-10-24 ENCOUNTER — Other Ambulatory Visit: Payer: Self-pay

## 2017-10-24 DIAGNOSIS — B9689 Other specified bacterial agents as the cause of diseases classified elsewhere: Secondary | ICD-10-CM

## 2017-10-24 DIAGNOSIS — J019 Acute sinusitis, unspecified: Secondary | ICD-10-CM

## 2017-10-24 DIAGNOSIS — W57XXXA Bitten or stung by nonvenomous insect and other nonvenomous arthropods, initial encounter: Secondary | ICD-10-CM | POA: Diagnosis not present

## 2017-10-24 DIAGNOSIS — Z0189 Encounter for other specified special examinations: Secondary | ICD-10-CM | POA: Diagnosis not present

## 2017-10-24 DIAGNOSIS — R52 Pain, unspecified: Secondary | ICD-10-CM

## 2017-10-24 LAB — POCT URINALYSIS DIP (MANUAL ENTRY)
Bilirubin, UA: NEGATIVE
Blood, UA: NEGATIVE
Glucose, UA: NEGATIVE mg/dL
Ketones, POC UA: NEGATIVE mg/dL
Leukocytes, UA: NEGATIVE
Nitrite, UA: NEGATIVE
Spec Grav, UA: 1.015 (ref 1.010–1.025)
Urobilinogen, UA: 2 E.U./dL — AB
pH, UA: 7.5 (ref 5.0–8.0)

## 2017-10-24 LAB — POCT CBC W AUTO DIFF (K'VILLE URGENT CARE)

## 2017-10-24 MED ORDER — DOXYCYCLINE HYCLATE 100 MG PO CAPS: 100.0000 mg | ORAL_CAPSULE | Freq: Two times a day (BID) | ORAL | 0 refills | Status: AC

## 2017-10-24 NOTE — ED Triage Notes (Signed)
52 y.o female presents to the clinic today c/o congestion, headache and intermittent fever. She states she's pulled 14 ticks off since April. She states her cough is productive and is thick green mucous. She denies prior treatment.

## 2017-10-24 NOTE — Discharge Instructions (Signed)
°  Please take antibiotics as prescribed and be sure to complete entire course even if you start to feel better to ensure infection does not come back. ° °Please follow up with family medicine in 1 week if not improving, sooner if significantly worsening. °

## 2017-10-24 NOTE — ED Provider Notes (Signed)
[facility]    CSN: [id number] Arrival date & time: 10/24/17  0806     History   Chief Complaint Chief Complaint  Patient presents with  . Nasal Congestion  . Headache    HPI Jennifer Benson is a 52 y.o. female.   HPI  Jennifer Benson is a 53 y.o. female presenting to UC with c/o about 3 weeks of congestion, frontal head pressure, body aches and intermittent fever, Tmax 102.7*F, associated productive cough with green sputum the last 1 week.  She was seen by her PCP last month for a routine physical but was also prescribed albuterol and inhaler at that time. Mild relief.  Pt also notes she has been bit by about 14 ticks since April and notes there are small red marks at each site on her body she was bit.   Pt denies urinary symptoms but is requesting her urine be tested for UTI due to fever. Pt is concerned because she only has one kidney.   No known sick contacts or recent travel. Denies n/v/d.    Past Medical History:  Diagnosis Date  . Chest pain   . Chronic pain   . COPD (chronic obstructive pulmonary disease) ([place])   . Heart palpitations   . Osteoarthritis   . Renal calculi   . Right renal atrophy   . SOB (shortness of breath)   . Thyroid disease     Patient Active Problem List   Diagnosis Date Noted  . Chest pain 03/30/2012  . Smoking 03/30/2012  . HYPOTHYROIDISM 07/20/2009  . FOOT PAIN, LEFT 07/20/2009    Past Surgical History:  Procedure Laterality Date  . CESAREAN SECTION    . CHOLECYSTECTOMY    . KIDNEY STONE SURGERY    . LAPAROSCOPIC NEPHRECTOMY    . NEPHRECTOMY Right     OB History   None      Home Medications    Prior to Admission medications   Medication Sig Start Date End Date Taking? Authorizing Provider  albuterol (PROVENTIL HFA;VENTOLIN HFA) 108 (90 Base) MCG/ACT inhaler Inhale 1-2 puffs into the lungs every 6 (six) hours as needed for wheezing or shortness of breath. 08/24/15   Noe Gens, PA-C  ALPRAZolam Duanne Moron) 1 MG  tablet Take 1 mg by mouth 3 (three) times daily as needed. anxiety    [provider]  aspirin 325 MG tablet Take 325 mg by mouth once.    [provider]  baclofen (LIORESAL) 10 MG tablet Take 10 mg by mouth 3 (three) times daily.    [provider]  benzonatate (TESSALON) 100 MG capsule Take 1 capsule (100 mg total) by mouth every 8 (eight) hours. 08/24/15   Noe Gens, PA-C  ciprofloxacin (CIPRO) 250 MG tablet Take 1 tablet (250 mg total) by mouth 2 (two) times daily. 05/03/13   Kandra Nicolas, MD  cyanocobalamin 1000 MCG tablet Take 1,000 mcg by mouth daily.    [provider]  doxycycline (VIBRAMYCIN) 100 MG capsule Take 1 capsule (100 mg total) by mouth 2 (two) times daily for 10 days. 10/24/17 11/03/17  Noe Gens, PA-C  GABAPENTIN & LIDOCAINE-MENTHOL CO by Combination route.    [provider]  gabapentin (NEURONTIN) 300 MG capsule Take 300 mg by mouth 3 (three) times daily.    [provider]  HYDROcodone-acetaminophen (NORCO) 10-325 MG per tablet Take 1 tablet by mouth every 6 (six) hours as needed.    [provider]  levothyroxine (SYNTHROID, LEVOTHROID) 150 MCG tablet Take 150 mcg by mouth daily.    [provider]  meloxicam (MOBIC) 7.5 MG tablet Take 7.5 mg by mouth daily.    [provider]  oxycodone-acetaminophen (LYNOX) 7.5-300 MG tablet Take 1 tablet by mouth every 4 (four) hours as needed for pain.    [provider]  propranolol ER (INDERAL LA) 60 MG 24 hr capsule Take one tab by mouth at bedtime 03/26/13   Kandra Nicolas, MD  traMADol-acetaminophen (ULTRACET) 37.5-325 MG per tablet 1 or 2 every 4-6 hours as needed for moderate-severe pain.  Caution: May cause drowsiness 09/28/13   Jacqulyn Cane, MD    Family History Family History  Problem Relation Age of Onset  . Thyroid disease Mother   . Heart attack Unknown        on mothers side of the family   . Stroke Unknown         on mothers side of the family     Social History Social History   Tobacco Use  . Smoking status: Current Every Day Smoker    Packs/day: 0.50    Years: 35.00    Pack years: 17.50    Types: Cigarettes  . Smokeless tobacco: Never Used  Substance Use Topics  . Alcohol use: No  . Drug use: No     Allergies   Demerol   Review of Systems Review of Systems  Constitutional: Positive for fever. Negative for chills.  HENT: Positive for congestion, sinus pressure and sinus pain. Negative for ear pain, sore throat, trouble swallowing and voice change.   Respiratory: Positive for cough. Negative for shortness of breath.   Cardiovascular: Negative for chest pain and palpitations.  Gastrointestinal: Negative for abdominal pain, diarrhea, nausea and vomiting.  Musculoskeletal: Positive for arthralgias, back pain and myalgias.  Skin: Positive for color change. Negative for rash.  Neurological: Positive for headaches. Negative for dizziness and light-headedness.     Physical Exam Triage Vital Signs ED Triage Vitals  Enc Vitals Group     BP 10/24/17 0831 120/84     Pulse Rate 10/24/17 0831 72     Resp --      Temp 10/24/17 0831 98.8 F (37.1 C)     Temp Source 10/24/17 0831 Oral     SpO2 10/24/17 0831 99 %     Weight 10/24/17 0832 153 lb (69.4 kg)     Height 10/24/17 0832 5\' 1"  (1.549 m)     Head Circumference --      Peak Flow --      Pain Score 10/24/17 0832 6     Pain Loc --      Pain Edu? --      Excl. in [place]? --    No data found.  Updated Vital Signs BP 120/84 (BP Location: Right Arm)   Pulse 72   Temp 98.8 F (37.1 C) (Oral)   Ht 5\' 1"  (1.549 m)   Wt 153 lb (69.4 kg)   SpO2 99%   BMI 28.91 kg/m   Visual Acuity Right Eye Distance:   Left Eye Distance:   Bilateral Distance:    Right Eye Near:   Left Eye Near:    Bilateral Near:     Physical Exam  Constitutional: She is oriented to person, place, and time. She appears well-developed and well-nourished.   Non-toxic appearance. She does not appear ill. No distress.  HENT:  Head: Normocephalic and atraumatic.  Right Ear: Tympanic  membrane normal.  Left Ear: Tympanic membrane normal.  Nose: Right sinus exhibits maxillary sinus tenderness and frontal sinus tenderness. Left sinus exhibits maxillary sinus tenderness and frontal sinus tenderness.  Mouth/Throat: Uvula is midline, oropharynx is clear and moist and mucous membranes are normal.  Eyes: EOM are normal.  Neck: Normal range of motion. Neck supple.  Cardiovascular: Normal rate and regular rhythm.  Pulmonary/Chest: Effort normal and breath sounds normal. No respiratory distress. She has no wheezes. She exhibits no tenderness.  Musculoskeletal: Normal range of motion.  Neurological: She is alert and oriented to person, place, and time.  Skin: Skin is warm and dry. No rash noted. There is erythema.  Back, abdomen, and Left leg: pinpoint erythematous lesions c/w bites. No red streaking, discharge or induration.   Psychiatric: She has a normal mood and affect. Her behavior is normal.  Nursing note and vitals reviewed.    UC Treatments / Results  Labs (all labs ordered are listed, but only abnormal results are displayed) Labs Reviewed  POCT URINALYSIS DIP (MANUAL ENTRY) - Abnormal; Notable for the following components:      Result Value   Protein Ur, POC trace (*)    Urobilinogen, UA 2.0 (*)    All other components within normal limits  POCT CBC W AUTO DIFF ([facility]) - Normal  B. BURGDORFI ANTIBODIES  ROCKY MTN SPOTTED FVR ABS PNL(IGG+IGM)    EKG None  Radiology No results found.  Procedures Procedures (including critical care time)  Medications Ordered in UC Medications - No data to display  Initial Impression / Assessment and Plan / UC Course  I have reviewed the triage vital signs and the nursing notes.  Pertinent labs & imaging results that were available during my care of the patient were reviewed by me and  considered in my medical decision making (see chart for details).     Hx and exam most c/w sinusitis Reassured pt of normal UA Blood work sent to lab for tick illness.   Final Clinical Impressions(s) / UC Diagnoses   Final diagnoses:  Acute bacterial rhinosinusitis  Body aches  Tick bite, initial encounter     Discharge Instructions      Please take antibiotics as prescribed and be sure to complete entire course even if you start to feel better to ensure infection does not come back.  Please follow up with family medicine in 1 week if not improving, sooner if significantly worsening.     ED Prescriptions    Medication Sig Dispense Auth. Provider   doxycycline (VIBRAMYCIN) 100 MG capsule Take 1 capsule (100 mg total) by mouth 2 (two) times daily for 10 days. 20 capsule Noe Gens, PA-C     Controlled Substance Prescriptions [place] Controlled Substance Registry consulted? Not Applicable   Tyrell Antonio 10/24/17 1014

## 2017-10-27 LAB — B. BURGDORFI ANTIBODIES: B burgdorferi Ab IgG+IgM: <0.9 index

## 2017-10-27 LAB — ROCKY MTN SPOTTED FVR ABS PNL(IGG+IGM)
RMSF IgG: NOT DETECTED
RMSF IgM: NOT DETECTED

## 2017-10-27 LAB — EXTRA LAV TOP TUBE

## 2017-10-28 ENCOUNTER — Telehealth: Payer: Self-pay | Admitting: *Deleted

## 2017-10-28 NOTE — Telephone Encounter (Signed)
Attempted to call with lab results. No answer. Charna Archer, LPN

## 2017-10-29 NOTE — Telephone Encounter (Signed)
LM to call back for lab results. Charna Archer, LPN

## 2017-10-30 NOTE — Telephone Encounter (Signed)
Feeling better, will follow up as needed.  Lab results given.

## 2017-11-05 ENCOUNTER — Other Ambulatory Visit: Payer: Self-pay | Admitting: Unknown Physician Specialty

## 2017-11-05 DIAGNOSIS — Z1231 Encounter for screening mammogram for malignant neoplasm of breast: Secondary | ICD-10-CM

## 2017-11-07 ENCOUNTER — Ambulatory Visit (INDEPENDENT_AMBULATORY_CARE_PROVIDER_SITE_OTHER): Payer: Medicare Other

## 2017-11-07 DIAGNOSIS — Z1231 Encounter for screening mammogram for malignant neoplasm of breast: Secondary | ICD-10-CM | POA: Diagnosis not present

## 2017-11-10 DIAGNOSIS — F332 Major depressive disorder, recurrent severe without psychotic features: Secondary | ICD-10-CM | POA: Diagnosis not present

## 2017-12-16 DIAGNOSIS — F332 Major depressive disorder, recurrent severe without psychotic features: Secondary | ICD-10-CM | POA: Diagnosis not present

## 2018-02-09 DIAGNOSIS — F332 Major depressive disorder, recurrent severe without psychotic features: Secondary | ICD-10-CM | POA: Diagnosis not present

## 2018-02-27 DIAGNOSIS — E559 Vitamin D deficiency, unspecified: Secondary | ICD-10-CM | POA: Diagnosis not present

## 2018-02-27 DIAGNOSIS — E538 Deficiency of other specified B group vitamins: Secondary | ICD-10-CM | POA: Diagnosis not present

## 2018-02-27 DIAGNOSIS — E039 Hypothyroidism, unspecified: Secondary | ICD-10-CM | POA: Diagnosis not present

## 2018-02-27 DIAGNOSIS — E7800 Pure hypercholesterolemia, unspecified: Secondary | ICD-10-CM | POA: Diagnosis not present

## 2018-02-27 DIAGNOSIS — Z23 Encounter for immunization: Secondary | ICD-10-CM | POA: Diagnosis not present

## 2018-03-09 DIAGNOSIS — F332 Major depressive disorder, recurrent severe without psychotic features: Secondary | ICD-10-CM | POA: Diagnosis not present

## 2018-05-19 DIAGNOSIS — F332 Major depressive disorder, recurrent severe without psychotic features: Secondary | ICD-10-CM | POA: Diagnosis not present

## 2018-05-27 DIAGNOSIS — F332 Major depressive disorder, recurrent severe without psychotic features: Secondary | ICD-10-CM | POA: Diagnosis not present

## 2018-07-01 DIAGNOSIS — F332 Major depressive disorder, recurrent severe without psychotic features: Secondary | ICD-10-CM | POA: Diagnosis not present

## 2018-07-28 DIAGNOSIS — F332 Major depressive disorder, recurrent severe without psychotic features: Secondary | ICD-10-CM | POA: Diagnosis not present

## 2018-08-18 DIAGNOSIS — F332 Major depressive disorder, recurrent severe without psychotic features: Secondary | ICD-10-CM | POA: Diagnosis not present

## 2018-09-04 DIAGNOSIS — R5383 Other fatigue: Secondary | ICD-10-CM | POA: Diagnosis not present

## 2018-09-04 DIAGNOSIS — E039 Hypothyroidism, unspecified: Secondary | ICD-10-CM | POA: Diagnosis not present

## 2018-09-04 DIAGNOSIS — E559 Vitamin D deficiency, unspecified: Secondary | ICD-10-CM | POA: Diagnosis not present

## 2018-09-04 DIAGNOSIS — M899 Disorder of bone, unspecified: Secondary | ICD-10-CM | POA: Diagnosis not present

## 2018-09-04 DIAGNOSIS — M8580 Other specified disorders of bone density and structure, unspecified site: Secondary | ICD-10-CM | POA: Diagnosis not present

## 2018-09-04 DIAGNOSIS — E538 Deficiency of other specified B group vitamins: Secondary | ICD-10-CM | POA: Diagnosis not present

## 2018-09-04 DIAGNOSIS — E7800 Pure hypercholesterolemia, unspecified: Secondary | ICD-10-CM | POA: Diagnosis not present

## 2018-09-08 ENCOUNTER — Other Ambulatory Visit: Payer: Self-pay | Admitting: Unknown Physician Specialty

## 2018-09-08 DIAGNOSIS — Z780 Asymptomatic menopausal state: Secondary | ICD-10-CM

## 2018-09-08 DIAGNOSIS — Z13820 Encounter for screening for osteoporosis: Secondary | ICD-10-CM

## 2018-09-08 DIAGNOSIS — Z1231 Encounter for screening mammogram for malignant neoplasm of breast: Secondary | ICD-10-CM

## 2018-09-08 DIAGNOSIS — F332 Major depressive disorder, recurrent severe without psychotic features: Secondary | ICD-10-CM | POA: Diagnosis not present

## 2018-11-10 DIAGNOSIS — F332 Major depressive disorder, recurrent severe without psychotic features: Secondary | ICD-10-CM | POA: Diagnosis not present

## 2018-11-11 ENCOUNTER — Ambulatory Visit (INDEPENDENT_AMBULATORY_CARE_PROVIDER_SITE_OTHER): Payer: Medicare Other

## 2018-11-11 ENCOUNTER — Other Ambulatory Visit: Payer: Self-pay

## 2018-11-11 DIAGNOSIS — Z780 Asymptomatic menopausal state: Secondary | ICD-10-CM

## 2018-11-11 DIAGNOSIS — Z1231 Encounter for screening mammogram for malignant neoplasm of breast: Secondary | ICD-10-CM

## 2018-11-11 DIAGNOSIS — E2839 Other primary ovarian failure: Secondary | ICD-10-CM | POA: Diagnosis not present

## 2018-11-11 DIAGNOSIS — Z13820 Encounter for screening for osteoporosis: Secondary | ICD-10-CM

## 2019-01-26 DIAGNOSIS — Z23 Encounter for immunization: Secondary | ICD-10-CM | POA: Diagnosis not present

## 2019-01-26 DIAGNOSIS — L309 Dermatitis, unspecified: Secondary | ICD-10-CM | POA: Diagnosis not present

## 2019-01-30 DIAGNOSIS — S60862A Insect bite (nonvenomous) of left wrist, initial encounter: Secondary | ICD-10-CM | POA: Diagnosis not present

## 2019-01-30 DIAGNOSIS — E039 Hypothyroidism, unspecified: Secondary | ICD-10-CM | POA: Diagnosis not present

## 2019-01-30 DIAGNOSIS — Z79899 Other long term (current) drug therapy: Secondary | ICD-10-CM | POA: Diagnosis not present

## 2019-01-30 DIAGNOSIS — R030 Elevated blood-pressure reading, without diagnosis of hypertension: Secondary | ICD-10-CM | POA: Diagnosis not present

## 2019-01-30 DIAGNOSIS — F419 Anxiety disorder, unspecified: Secondary | ICD-10-CM | POA: Diagnosis not present

## 2019-01-30 DIAGNOSIS — L539 Erythematous condition, unspecified: Secondary | ICD-10-CM | POA: Diagnosis not present

## 2019-01-30 DIAGNOSIS — F17210 Nicotine dependence, cigarettes, uncomplicated: Secondary | ICD-10-CM | POA: Diagnosis not present

## 2019-01-30 DIAGNOSIS — Z888 Allergy status to other drugs, medicaments and biological substances status: Secondary | ICD-10-CM | POA: Diagnosis not present

## 2019-01-30 DIAGNOSIS — G629 Polyneuropathy, unspecified: Secondary | ICD-10-CM | POA: Diagnosis not present

## 2019-02-02 DIAGNOSIS — F332 Major depressive disorder, recurrent severe without psychotic features: Secondary | ICD-10-CM | POA: Diagnosis not present

## 2019-02-25 ENCOUNTER — Other Ambulatory Visit: Payer: Self-pay

## 2019-02-25 ENCOUNTER — Emergency Department (INDEPENDENT_AMBULATORY_CARE_PROVIDER_SITE_OTHER)
Admission: EM | Admit: 2019-02-25 | Discharge: 2019-02-25 | Disposition: A | Discharge status: Home / Self Care | Payer: Medicare Other | Source: Home / Self Care

## 2019-02-25 ENCOUNTER — Encounter: Payer: Self-pay | Admitting: Family Medicine

## 2019-02-25 DIAGNOSIS — M7581 Other shoulder lesions, right shoulder: Secondary | ICD-10-CM

## 2019-02-25 HISTORY — DX: Polyneuropathy, unspecified: G62.9

## 2019-02-25 HISTORY — DX: Multiple sclerosis: G35

## 2019-02-25 HISTORY — DX: Multiple sclerosis, unspecified: G35.D

## 2019-02-25 MED ORDER — PREDNISONE 20 MG PO TABS: ORAL_TABLET | ORAL | 1 refills | Status: AC

## 2019-02-25 NOTE — Discharge Instructions (Addendum)
You need to do 2 exercises as the medicine starts to ease the pain: 1-move your right arm in circles as you lean over supporting your weight with your left arm 2-put your arm on the wall and gently move your body up and down, then left and right to stretch out the shoulder gently  If you are not seeing improvement in 1 week, you will need to see an orthopedic person to consider using injection into the shoulder.

## 2019-02-25 NOTE — ED Provider Notes (Signed)
[facility]    CSN: [id number] Arrival date & time: 02/25/19  1419      History   Chief Complaint Chief Complaint  Patient presents with  . Shoulder Pain    HPI JULLIETTE Benson is a 53 y.o. female.   Established [facility] patient  This is a 53 year old disabled woman who has 6 weeks of right shoulder pain.  She is left arm dominant.  She recalls no injury.  She has had progressive sharp pains intermittently in the right shoulder, made worse by movement.  In particular she has pain when she raises her arm over her shoulder.  Patient has no other joint issues.  This particular joint pain has not been a problem in the past.     Past Medical History:  Diagnosis Date  . Chest pain   . Chronic pain   . COPD (chronic obstructive pulmonary disease) ([place])   . Heart palpitations   . MS (multiple sclerosis) ([place])   . Neuropathy   . Osteoarthritis   . Renal calculi   . Right renal atrophy   . SOB (shortness of breath)   . Thyroid disease     Patient Active Problem List   Diagnosis Date Noted  . Chest pain 03/30/2012  . Smoking 03/30/2012  . HYPOTHYROIDISM 07/20/2009  . FOOT PAIN, LEFT 07/20/2009    Past Surgical History:  Procedure Laterality Date  . CESAREAN SECTION    . CHOLECYSTECTOMY    . KIDNEY STONE SURGERY    . LAPAROSCOPIC NEPHRECTOMY    . NEPHRECTOMY Right     OB History   No obstetric history on file.      Home Medications    Prior to Admission medications   Medication Sig Start Date End Date Taking? Authorizing Provider  amphetamine-dextroamphetamine (ADDERALL) 30 MG tablet Take 30 mg by mouth daily.   Yes [provider]  albuterol (PROVENTIL HFA;VENTOLIN HFA) 108 (90 Base) MCG/ACT inhaler Inhale 1-2 puffs into the lungs every 6 (six) hours as needed for wheezing or shortness of breath. 08/24/15   Noe Gens, PA-C  ALPRAZolam Duanne Moron) 1 MG tablet Take 1 mg by mouth 3 (three) times daily as needed. anxiety    [provider]  aspirin 325 MG tablet Take 325 mg by mouth once.    [provider]  baclofen (LIORESAL) 10 MG tablet Take 10 mg by mouth 3 (three) times daily.    [provider]  benzonatate (TESSALON) 100 MG capsule Take 1 capsule (100 mg total) by mouth every 8 (eight) hours. 08/24/15   Noe Gens, PA-C  ciprofloxacin (CIPRO) 250 MG tablet Take 1 tablet (250 mg total) by mouth 2 (two) times daily. 05/03/13   Kandra Nicolas, MD  cyanocobalamin 1000 MCG tablet Take 1,000 mcg by mouth daily.    [provider]  GABAPENTIN & LIDOCAINE-MENTHOL CO by Combination route.    [provider]  gabapentin (NEURONTIN) 300 MG capsule Take 300 mg by mouth 3 (three) times daily.    [provider]  HYDROcodone-acetaminophen (NORCO) 10-325 MG per tablet Take 1 tablet by mouth every 6 (six) hours as needed.    [provider]  lamoTRIgine (LAMICTAL) 150 MG tablet  11/10/18   [provider]  levothyroxine (SYNTHROID, LEVOTHROID) 150 MCG tablet Take 150 mcg by mouth daily.    [provider]  meloxicam (MOBIC) 7.5 MG tablet Take 7.5 mg by mouth daily.    [provider]  oxycodone-acetaminophen (LYNOX) 7.5-300 MG tablet Take 1 tablet by mouth every 4 (four) hours as needed for pain.    [provider]  predniSONE (DELTASONE) 20 MG tablet 2 daily with food for a week, then one a day for a week 02/25/19   Robyn Haber, MD  propranolol ER (INDERAL LA) 60 MG 24 hr capsule Take one tab by mouth at bedtime 03/26/13   Kandra Nicolas, MD  traMADol-acetaminophen (ULTRACET) 37.5-325 MG per tablet 1 or 2 every 4-6 hours as needed for moderate-severe pain.  Caution: May cause drowsiness 09/28/13   Jacqulyn Cane, MD    Family History Family History  Problem Relation Age of Onset  . Thyroid disease Mother   . Heart attack Other        on mothers side of the family   . Stroke Other        on mothers side of the family     Social  History Social History   Tobacco Use  . Smoking status: Current Every Day Smoker    Packs/day: 0.50    Years: 35.00    Pack years: 17.50    Types: Cigarettes  . Smokeless tobacco: Never Used  Substance Use Topics  . Alcohol use: No  . Drug use: No     Allergies   Demerol   Review of Systems Review of Systems  Musculoskeletal: Negative for neck pain.  All other systems reviewed and are negative.    Physical Exam Triage Vital Signs ED Triage Vitals  Enc Vitals Group     BP      Pulse      Resp      Temp      Temp src      SpO2      Weight      Height      Head Circumference      Peak Flow      Pain Score      Pain Loc      Pain Edu?      Excl. in [place]?    No data found.  Updated Vital Signs BP 131/85 (BP Location: Right Arm)   Pulse 65   Temp 98 F (36.7 C) (Temporal)   Resp 20   Ht 5\' 2"  (1.575 m)   Wt 68.9 kg   LMP 08/24/2015   SpO2 100%   BMI 27.80 kg/m    Physical Exam Vitals signs and nursing note reviewed.  Constitutional:      General: She is not in acute distress.    Appearance: Normal appearance. She is normal weight. She is not toxic-appearing.  HENT:     Head: Normocephalic and atraumatic.  Eyes:     Conjunctiva/sclera: Conjunctivae normal.  Neck:     Musculoskeletal: Normal range of motion and neck supple.  Pulmonary:     Effort: Pulmonary effort is normal.  Musculoskeletal:        General: Tenderness present. No deformity or signs of injury.     Comments: Patient has pain in her right shoulder with abduction of the arm above shoulder height.  Empty can test is also positive.  She is tender in the anterior joint line without significant swelling.  Skin:    General: Skin is warm and dry.  Neurological:     General: No focal deficit present.     Mental Status: She is alert.  Psychiatric:        Mood and Affect: Mood normal.  Behavior: Behavior normal.      UC Treatments / Results  Labs (all labs ordered are  listed, but only abnormal results are displayed) Labs Reviewed - No data to display  EKG   Radiology No results found.  Procedures Procedures (including critical care time)  Medications Ordered in UC Medications - No data to display  Initial Impression / Assessment and Plan / UC Course  I have reviewed the triage vital signs and the nursing notes.  Pertinent labs & imaging results that were available during my care of the patient were reviewed by me and considered in my medical decision making (see chart for details).    Final Clinical Impressions(s) / UC Diagnoses   Final diagnoses:  Right rotator cuff tendonitis     Discharge Instructions     You need to do 2 exercises as the medicine starts to ease the pain: 1-move your right arm in circles as you lean over supporting your weight with your left arm 2-put your arm on the wall and gently move your body up and down, then left and right to stretch out the shoulder gently  If you are not seeing improvement in 1 week, you will need to see an orthopedic person to consider using injection into the shoulder.    ED Prescriptions    Medication Sig Dispense Auth. Provider   predniSONE (DELTASONE) 20 MG tablet 2 daily with food for a week, then one a day for a week 21 tablet Robyn Haber, MD     I have reviewed the PDMP during this encounter.   Robyn Haber, MD 02/25/19 1452

## 2019-02-25 NOTE — ED Triage Notes (Signed)
Pain in the right shoulder.  Has been going on for about 6 weeks.  Sharp pain that shoots down arm.  Spot that is very tender to the touch.  Denies injury.

## 2019-03-02 DIAGNOSIS — E039 Hypothyroidism, unspecified: Secondary | ICD-10-CM | POA: Diagnosis not present

## 2019-03-02 DIAGNOSIS — E7800 Pure hypercholesterolemia, unspecified: Secondary | ICD-10-CM | POA: Diagnosis not present

## 2019-03-02 DIAGNOSIS — G629 Polyneuropathy, unspecified: Secondary | ICD-10-CM | POA: Diagnosis not present

## 2019-04-26 DIAGNOSIS — F332 Major depressive disorder, recurrent severe without psychotic features: Secondary | ICD-10-CM | POA: Diagnosis not present

## 2019-06-24 DIAGNOSIS — D125 Benign neoplasm of sigmoid colon: Secondary | ICD-10-CM | POA: Diagnosis not present

## 2019-06-24 DIAGNOSIS — Z86010 Personal history of colonic polyps: Secondary | ICD-10-CM | POA: Diagnosis not present

## 2019-07-19 DIAGNOSIS — F332 Major depressive disorder, recurrent severe without psychotic features: Secondary | ICD-10-CM | POA: Diagnosis not present

## 2019-08-27 DIAGNOSIS — R5383 Other fatigue: Secondary | ICD-10-CM | POA: Diagnosis not present

## 2019-08-27 DIAGNOSIS — E538 Deficiency of other specified B group vitamins: Secondary | ICD-10-CM | POA: Diagnosis not present

## 2019-08-27 DIAGNOSIS — G629 Polyneuropathy, unspecified: Secondary | ICD-10-CM | POA: Diagnosis not present

## 2019-08-27 DIAGNOSIS — E039 Hypothyroidism, unspecified: Secondary | ICD-10-CM | POA: Diagnosis not present

## 2019-08-27 DIAGNOSIS — E7800 Pure hypercholesterolemia, unspecified: Secondary | ICD-10-CM | POA: Diagnosis not present

## 2019-08-27 DIAGNOSIS — Z0000 Encounter for general adult medical examination without abnormal findings: Secondary | ICD-10-CM | POA: Diagnosis not present

## 2019-08-27 DIAGNOSIS — M8580 Other specified disorders of bone density and structure, unspecified site: Secondary | ICD-10-CM | POA: Diagnosis not present

## 2019-08-27 DIAGNOSIS — E559 Vitamin D deficiency, unspecified: Secondary | ICD-10-CM | POA: Diagnosis not present

## 2019-08-27 DIAGNOSIS — Z01419 Encounter for gynecological examination (general) (routine) without abnormal findings: Secondary | ICD-10-CM | POA: Diagnosis not present

## 2019-08-27 DIAGNOSIS — M899 Disorder of bone, unspecified: Secondary | ICD-10-CM | POA: Diagnosis not present

## 2019-12-23 ENCOUNTER — Emergency Department (INDEPENDENT_AMBULATORY_CARE_PROVIDER_SITE_OTHER)
Admission: EM | Admit: 2019-12-23 | Discharge: 2019-12-23 | Disposition: A | Discharge status: Home / Self Care | Payer: Medicare Other | Source: Home / Self Care

## 2019-12-23 ENCOUNTER — Encounter: Payer: Self-pay | Admitting: Emergency Medicine

## 2019-12-23 ENCOUNTER — Other Ambulatory Visit: Payer: Self-pay

## 2019-12-23 ENCOUNTER — Emergency Department (INDEPENDENT_AMBULATORY_CARE_PROVIDER_SITE_OTHER): Payer: Medicare Other

## 2019-12-23 DIAGNOSIS — W010XXA Fall on same level from slipping, tripping and stumbling without subsequent striking against object, initial encounter: Secondary | ICD-10-CM | POA: Diagnosis not present

## 2019-12-23 DIAGNOSIS — M25511 Pain in right shoulder: Secondary | ICD-10-CM

## 2019-12-23 DIAGNOSIS — S4991XA Unspecified injury of right shoulder and upper arm, initial encounter: Secondary | ICD-10-CM

## 2019-12-23 MED ORDER — ACETAMINOPHEN 325 MG PO TABS
650.0000 mg | ORAL_TABLET | Freq: Once | ORAL | Status: AC
  Administered 2019-12-23: 650 mg via ORAL

## 2019-12-23 NOTE — ED Provider Notes (Signed)
[facility]    CSN: [id number] Arrival date & time: 12/23/19  1630      History   Chief Complaint Chief Complaint  Patient presents with  . Shoulder Injury  . Shoulder Pain    HPI Jennifer Benson is a 54 y.o. female.   HPI Jennifer Benson is a 54 y.o. female presenting to UC with c/o sudden Right shoulder pain after trip and fall this morning at home around 915AM, landed on floor directly onto her shoulder. Pain is 9/10, no medication taken PTA. Limited ROM due to pain.  Pt states she cannot take NSAIDs. She is Right hand dominant. Denies hitting her head or LOC during fall.    Past Medical History:  Diagnosis Date  . Chest pain   . Chronic pain   . COPD (chronic obstructive pulmonary disease) ([place])   . Heart palpitations   . MS (multiple sclerosis) ([place])   . Neuropathy   . Osteoarthritis   . Renal calculi   . Right renal atrophy   . SOB (shortness of breath)   . Thyroid disease     Patient Active Problem List   Diagnosis Date Noted  . Acute hypoxemic respiratory failure ([place]) 01/06/2015  . Bacteria in urine 01/06/2015  . Cervical radiculopathy 01/06/2015  . COPD exacerbation ([place]) 01/06/2015  . Chest pain 03/30/2012  . Smoking 03/30/2012  . HYPOTHYROIDISM 07/20/2009  . FOOT PAIN, LEFT 07/20/2009    Past Surgical History:  Procedure Laterality Date  . CESAREAN SECTION    . CHOLECYSTECTOMY    . KIDNEY STONE SURGERY    . LAPAROSCOPIC NEPHRECTOMY    . NEPHRECTOMY Right     OB History   No obstetric history on file.      Home Medications    Prior to Admission medications   Medication Sig Start Date End Date Taking? Authorizing Provider  ALPRAZolam Duanne Moron) 1 MG tablet Take 1 mg by mouth 3 (three) times daily as needed. anxiety   Yes [provider]  gabapentin (NEURONTIN) 300 MG capsule Take 300 mg by mouth 3 (three) times daily.   Yes [provider]  levothyroxine (SYNTHROID, LEVOTHROID) 150 MCG tablet Take 150 mcg by  mouth daily.   Yes [provider]  albuterol (PROVENTIL HFA;VENTOLIN HFA) 108 (90 Base) MCG/ACT inhaler Inhale 1-2 puffs into the lungs every 6 (six) hours as needed for wheezing or shortness of breath. 08/24/15   Noe Gens, PA-C  amphetamine-dextroamphetamine (ADDERALL) 30 MG tablet Take 30 mg by mouth daily.    [provider]  aspirin 325 MG tablet Take 325 mg by mouth once.    [provider]  baclofen (LIORESAL) 10 MG tablet Take 10 mg by mouth 3 (three) times daily.    [provider]  benzonatate (TESSALON) 100 MG capsule Take 1 capsule (100 mg total) by mouth every 8 (eight) hours. 08/24/15   Noe Gens, PA-C  ciprofloxacin (CIPRO) 250 MG tablet Take 1 tablet (250 mg total) by mouth 2 (two) times daily. 05/03/13   Kandra Nicolas, MD  cyanocobalamin (,VITAMIN B-12,) 1000 MCG/ML injection Inject into the muscle. 09/21/19   [provider]  cyanocobalamin 1000 MCG tablet Take 1,000 mcg by mouth daily.    [provider]  GABAPENTIN & LIDOCAINE-MENTHOL CO by Combination route.    [provider]  HYDROcodone-acetaminophen (NORCO) 10-325 MG per tablet Take 1 tablet by mouth every 6 (six) hours as needed.    [provider]  lamoTRIgine (LAMICTAL) 150 MG tablet  11/10/18   [provider]  meloxicam (MOBIC) 7.5 MG tablet Take 7.5 mg by mouth daily.    [provider]  oxycodone-acetaminophen (LYNOX) 7.5-300 MG tablet Take 1 tablet by mouth every 4 (four) hours as needed for pain.    [provider]  predniSONE (DELTASONE) 20 MG tablet 2 daily with food for a week, then one a day for a week 02/25/19   Robyn Haber, MD  propranolol ER (INDERAL LA) 60 MG 24 hr capsule Take one tab by mouth at bedtime 03/26/13   Kandra Nicolas, MD  traMADol-acetaminophen (ULTRACET) 37.5-325 MG per tablet 1 or 2 every 4-6 hours as needed for moderate-severe pain.  Caution: May cause drowsiness 09/28/13    Jacqulyn Cane, MD  Vitamin D, Ergocalciferol, (DRISDOL) 1.25 MG (50000 UNIT) CAPS capsule Take 50,000 Units by mouth once a week. 08/11/19   [provider]    Family History Family History  Problem Relation Age of Onset  . Thyroid disease Mother   . Heart attack Other        on mothers side of the family   . Stroke Other        on mothers side of the family     Social History Social History   Tobacco Use  . Smoking status: Current Every Day Smoker    Packs/day: 0.75    Years: 35.00    Pack years: 26.25    Types: Cigarettes  . Smokeless tobacco: Never Used  Vaping Use  . Vaping Use: Never used  Substance Use Topics  . Alcohol use: No  . Drug use: No     Allergies   Demerol   Review of Systems Review of Systems  Musculoskeletal: Positive for arthralgias and myalgias.  Skin: Negative for color change and wound.  Neurological: Positive for weakness (Right arm due to pain in shoulder). Negative for numbness.     Physical Exam Triage Vital Signs ED Triage Vitals  Enc Vitals Group     BP 12/23/19 1713 (!) 149/79     Pulse --      Resp 12/23/19 1711 17     Temp 12/23/19 1713 99 F (37.2 C)     Temp Source 12/23/19 1711 Oral     SpO2 12/23/19 1711 100 %     Weight 12/23/19 1718 152 lb 1.9 oz (69 kg)     Height 12/23/19 1718 5\' 2"  (1.575 m)     Head Circumference --      Peak Flow --      Pain Score 12/23/19 1738 9     Pain Loc --      Pain Edu? --      Excl. in [place]? --    No data found.  Updated Vital Signs BP (!) 149/79   Temp 99 F (37.2 C)   Resp 17   Ht 5\' 2"  (1.575 m)   Wt 152 lb 1.9 oz (69 kg)   LMP 08/24/2015   SpO2 100%   BMI 27.82 kg/m   Visual Acuity Right Eye Distance:   Left Eye Distance:   Bilateral Distance:    Right Eye Near:   Left Eye Near:    Bilateral Near:     Physical Exam Vitals and nursing note reviewed.  Constitutional:      Appearance: Normal appearance. She is well-developed.  HENT:     Head:  Normocephalic and atraumatic.  Neck:  Comments: No midline bone tenderness, no crepitus or step-offs.  Cardiovascular:     Rate and Rhythm: Normal rate and regular rhythm.     Pulses:          Radial pulses are 2+ on the right side.  Pulmonary:     Effort: Pulmonary effort is normal. No respiratory distress.  Musculoskeletal:        General: Tenderness present.     Right shoulder: Tenderness and bony tenderness present. Decreased range of motion.     Right elbow: Normal range of motion. No tenderness.     Right wrist: Normal.     Right hand: Normal.     Cervical back: Normal range of motion.  Skin:    General: Skin is warm and dry.     Capillary Refill: Capillary refill takes less than 2 seconds.  Neurological:     Mental Status: She is alert and oriented to person, place, and time.     Sensory: No sensory deficit.  Psychiatric:        Behavior: Behavior normal.      UC Treatments / Results  Labs (all labs ordered are listed, but only abnormal results are displayed) Labs Reviewed - No data to display  EKG   Radiology DG Shoulder Right  Result Date: 12/23/2019 CLINICAL DATA:  Fall, right shoulder pain EXAM: RIGHT SHOULDER - 2+ VIEW COMPARISON:  None. FINDINGS: Degenerative changes in the [facility] joint with joint space narrowing and spurring. Glenohumeral joint is maintained. No acute bony abnormality. Specifically, no fracture, subluxation, or dislocation. Soft tissues are intact. IMPRESSION: Degenerative changes in the right AC joint. No acute bony abnormality. Electronically Signed   By: Rolm Baptise M.D.   On: 12/23/2019 18:15    Procedures Procedures (including critical care time)  Medications Ordered in UC Medications  acetaminophen (TYLENOL) tablet 650 mg (650 mg Oral Given 12/23/19 1727)    Initial Impression / Assessment and Plan / UC Course  I have reviewed the triage vital signs and the nursing notes.  Pertinent labs & imaging results that were available  during my care of the patient were reviewed by me and considered in my medical decision making (see chart for details).     Discussed imaging with pt Pt placed in sling for comfort Encouraged f/u with Sports Medicine or Orthopedist due to severity of pain despite no fracture or dislocation, concern for RCT. AVS given  Final Clinical Impressions(s) / UC Diagnoses   Final diagnoses:  Right shoulder injury, initial encounter     Discharge Instructions       Call to schedule a follow up appointment with Sports Medicine next week if not improving over the weekend.     ED Prescriptions    None     I have reviewed the PDMP during this encounter.   Noe Gens, PA-C 12/24/19 1009

## 2019-12-23 NOTE — ED Triage Notes (Signed)
Pt fell at home today in the kitchen at [place]- landed on R shoulder - per pt she was walking to fast and slipped Denies LOC  Pain to Right shoulder - radiates to elbow & up R side off neck C/o pain 9/10- no OTC meds at home  No ice at home  Limited ROM Can not take NSAIDS  COVID vaccine [employer]

## 2019-12-23 NOTE — Discharge Instructions (Signed)
   Call to schedule a follow up appointment with Sports Medicine next week if not improving over the weekend.

## 2020-01-24 DIAGNOSIS — F332 Major depressive disorder, recurrent severe without psychotic features: Secondary | ICD-10-CM | POA: Diagnosis not present

## 2020-02-24 DIAGNOSIS — L2389 Allergic contact dermatitis due to other agents: Secondary | ICD-10-CM | POA: Diagnosis not present

## 2020-05-02 DIAGNOSIS — F332 Major depressive disorder, recurrent severe without psychotic features: Secondary | ICD-10-CM | POA: Diagnosis not present

## 2020-08-25 IMAGING — MG DIGITAL SCREENING BILATERAL MAMMOGRAM WITH TOMO AND CAD
8 of 14 series · 8 of 40 positions shown · non-contrast
Comparison: Previous exam(s).

CLINICAL DATA: Screening.

EXAM:
DIGITAL SCREENING BILATERAL MAMMOGRAM WITH TOMO AND CAD

[L MLO synth-2D]
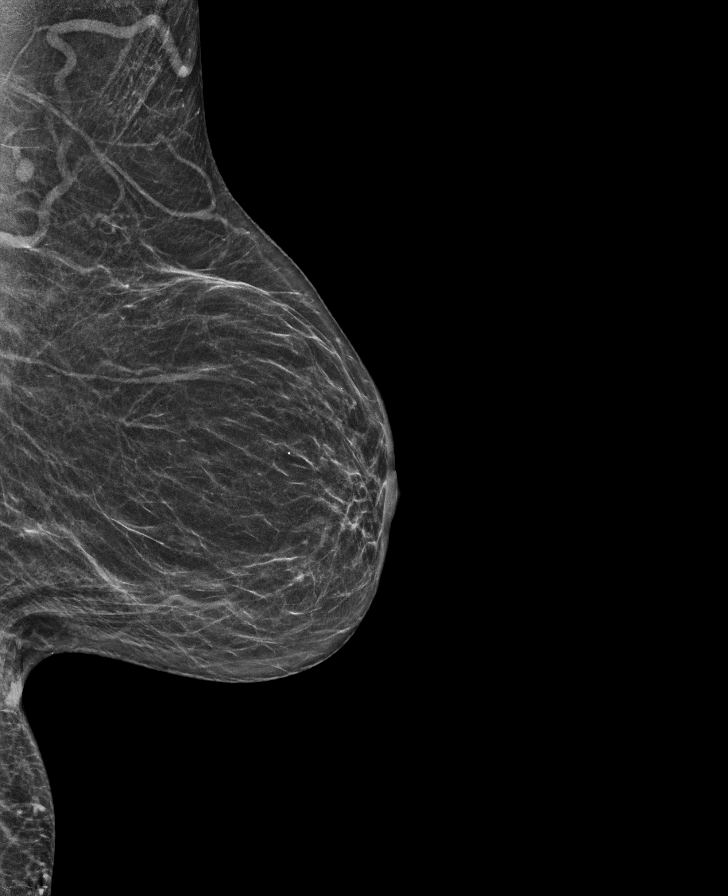

[L XCCL synth-2D]
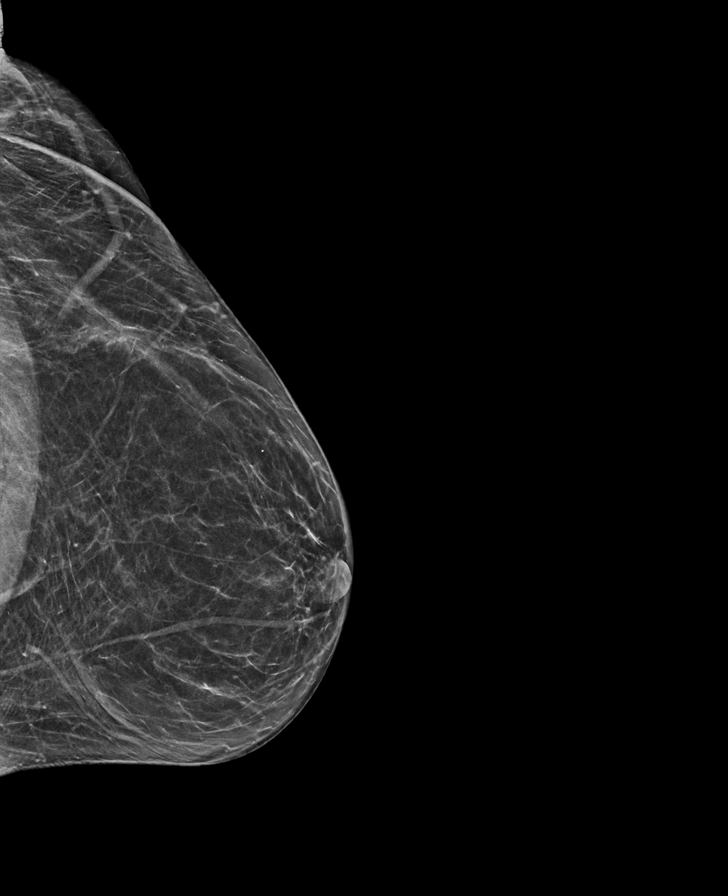

[L CC synth-2D]
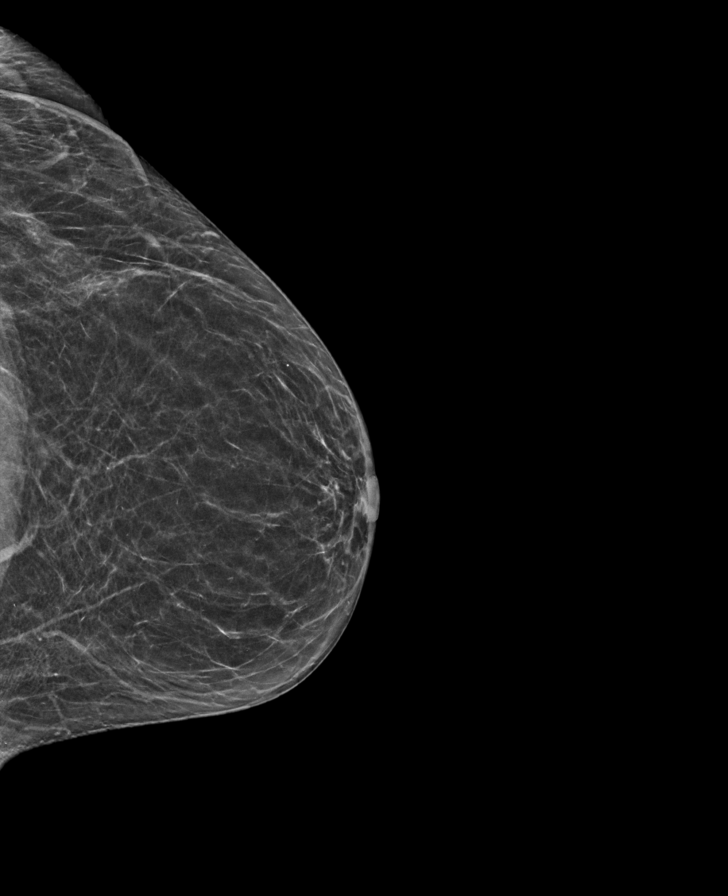

[R MLO synth-2D (1 of 2)]
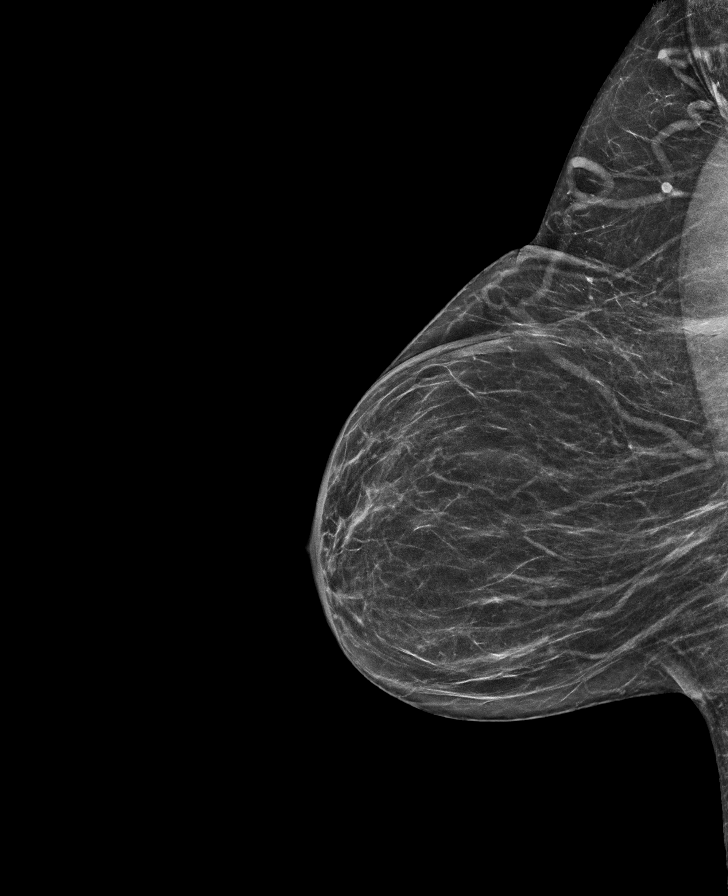

[R CC synth-2D]
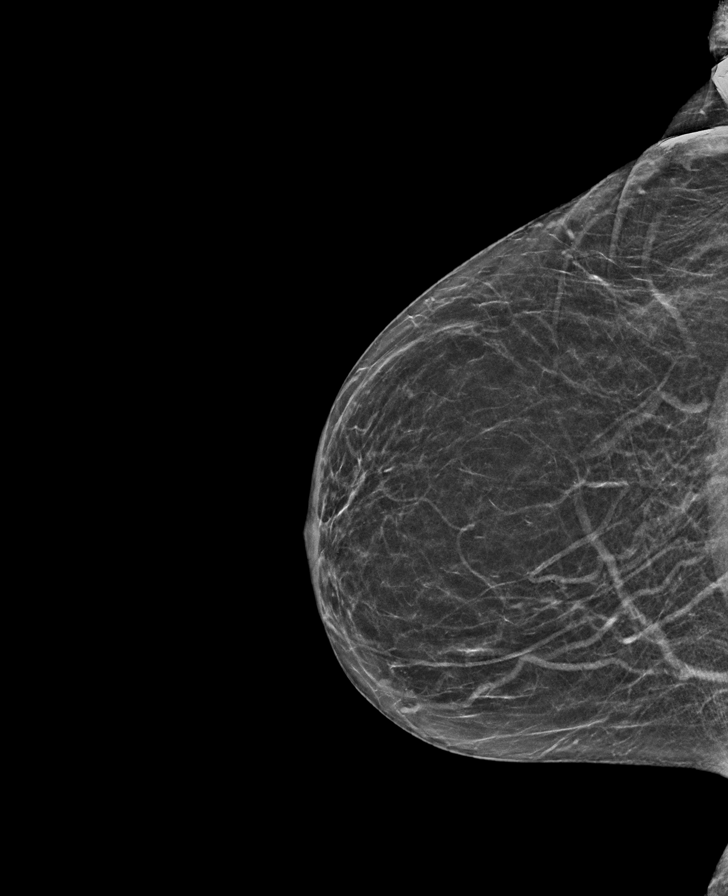

[R MLO synth-2D (2 of 2)]
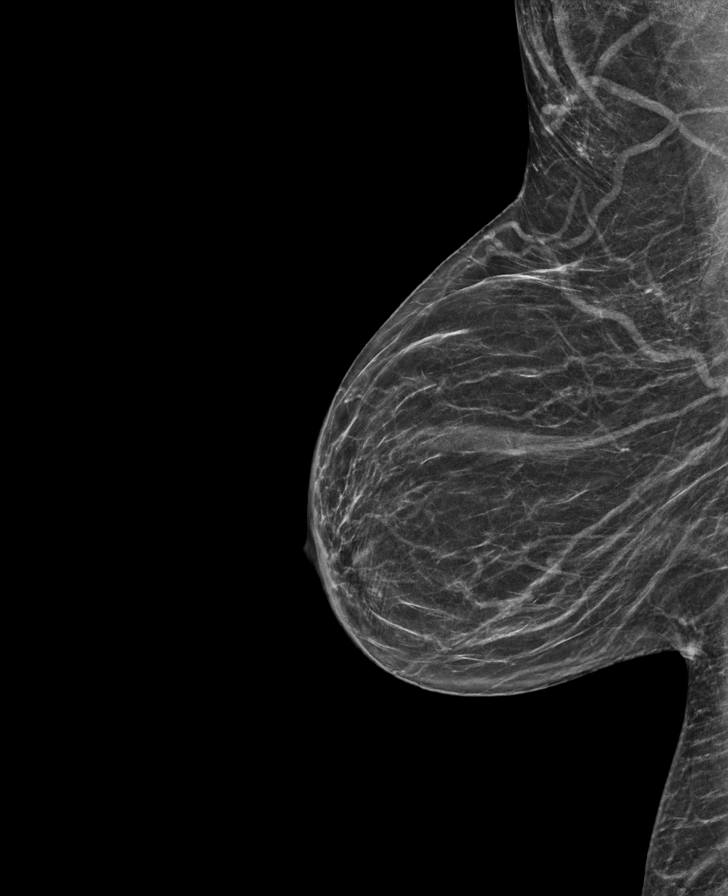

[R XCCL synth-2D]
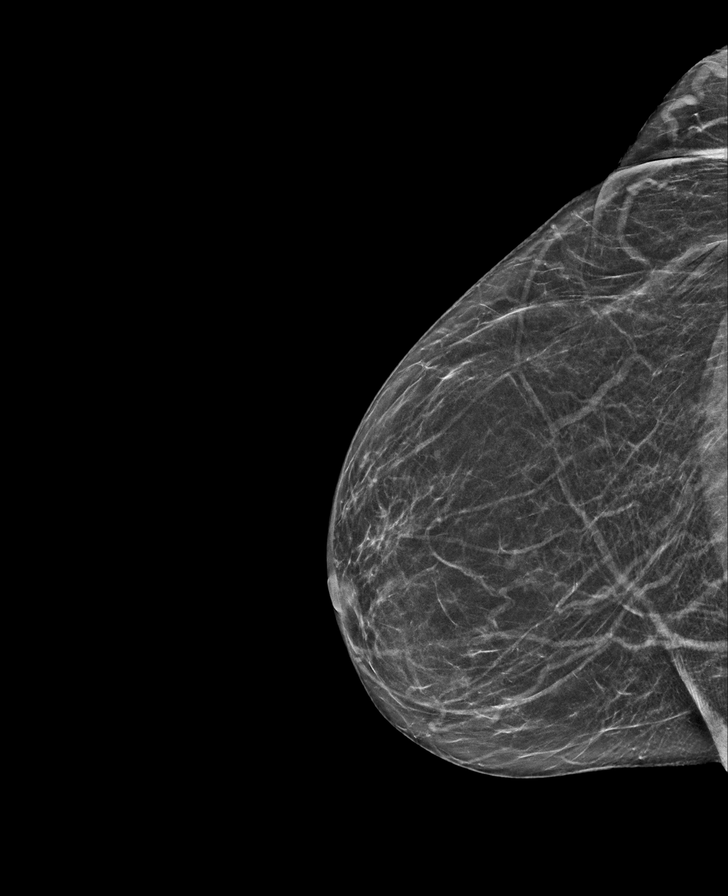

[R MLO tomo · tomo slice 29/56.0]
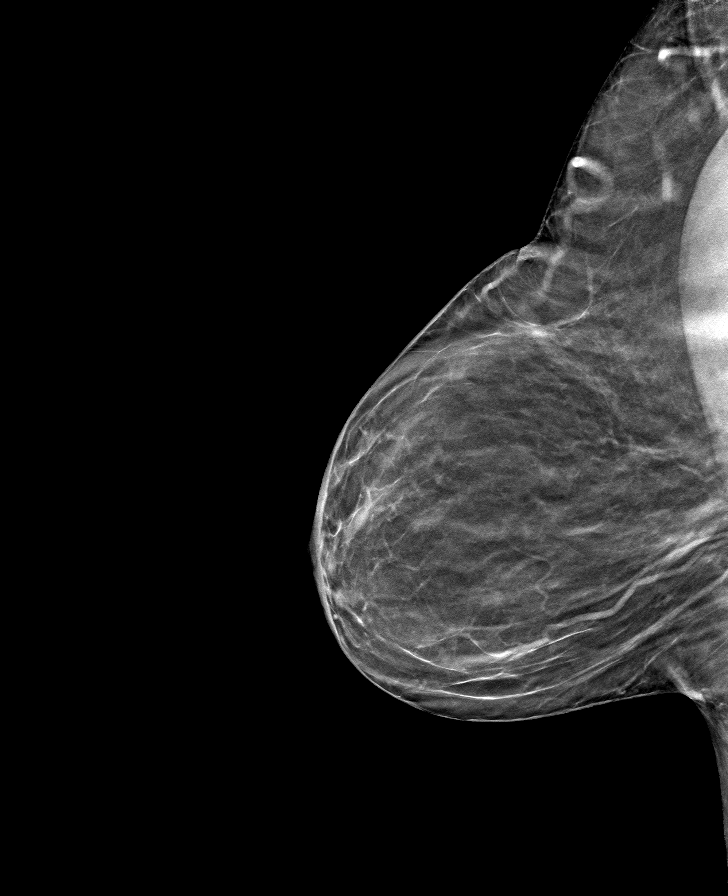

[8 of 40 positions shown; findings below may reference images not displayed]

ACR Breast Density Category b: There are scattered areas of
fibroglandular density.
FINDINGS: There are no findings suspicious for malignancy. Images were
processed with CAD.
IMPRESSION: No mammographic evidence of malignancy. A result letter of this
screening mammogram will be mailed directly to the patient.

RECOMMENDATION:
Screening mammogram in one year. (Code:[id number])

BI-RADS CATEGORY  1: Negative.

## 2021-10-06 IMAGING — DX DG SHOULDER 2+V*R*
3 series · 3 of 3 positions shown · non-contrast
Comparison: None.

CLINICAL DATA: Fall, right shoulder pain

EXAM:
RIGHT SHOULDER - 2+ VIEW

[shoulder grashey]
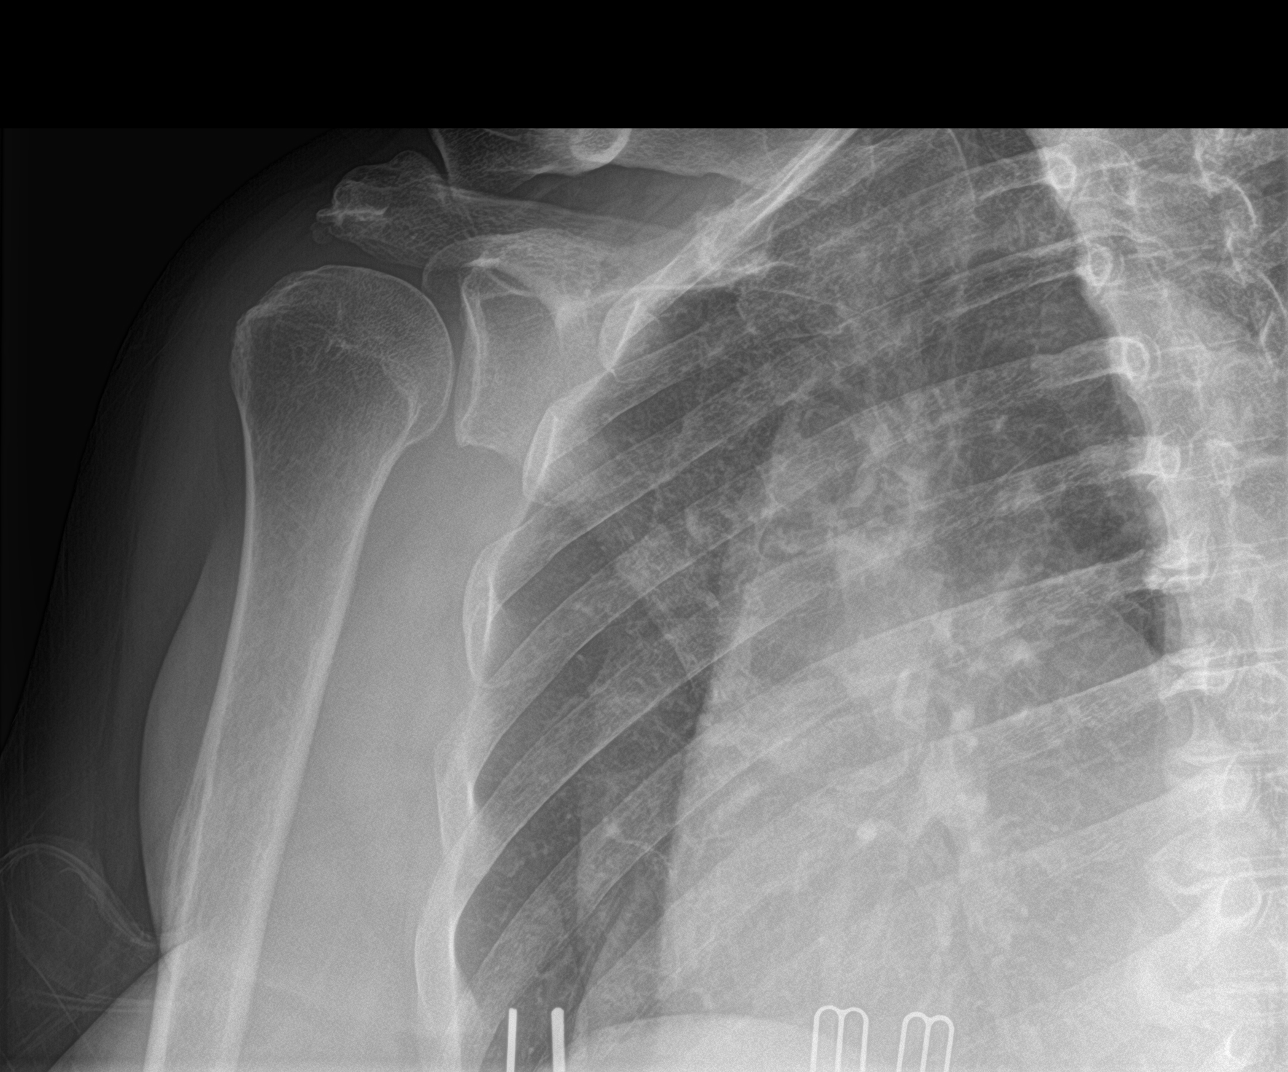

[shoulder y view]
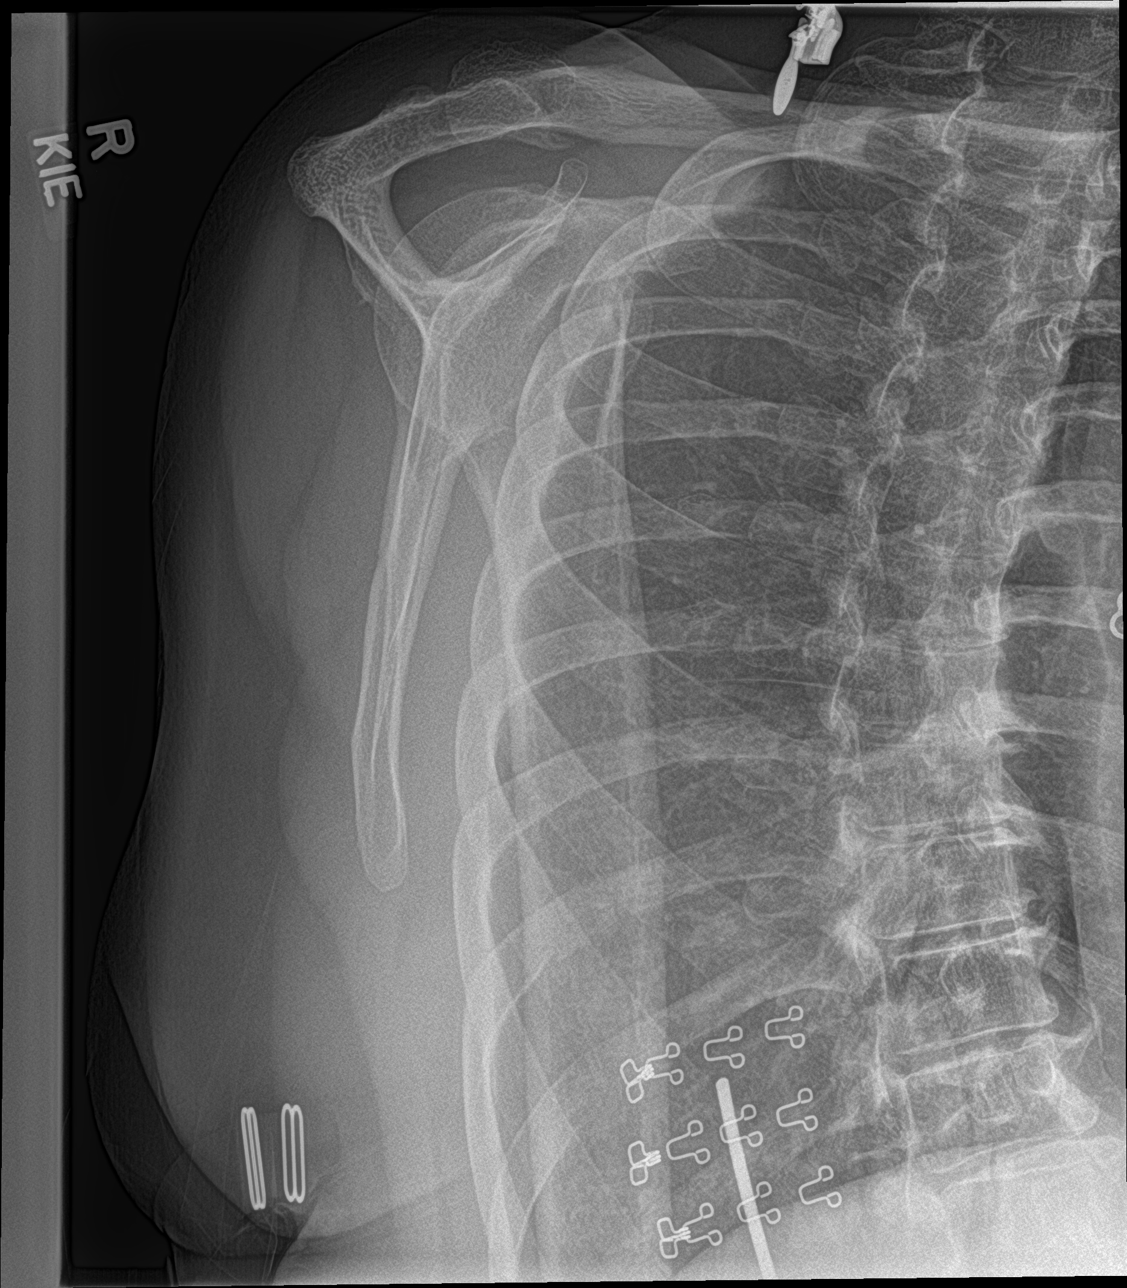

[shoulder ap neutral]
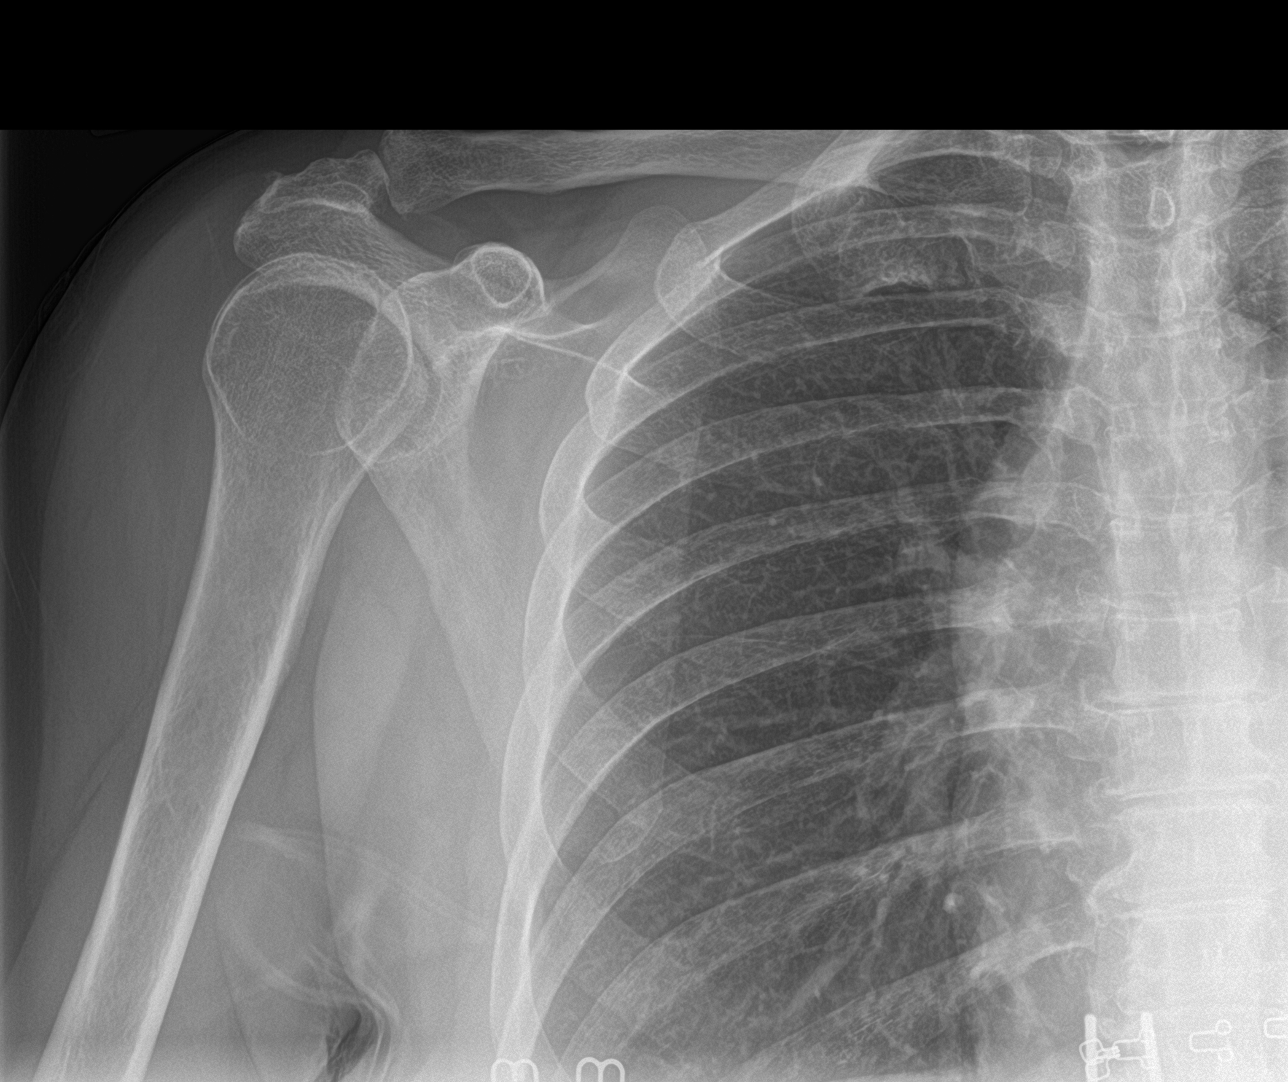

[3 of 3 positions shown; findings below may reference images not displayed]

FINDINGS: Degenerative changes in the AC joint with joint space narrowing and
spurring. Glenohumeral joint is maintained. No acute bony
abnormality. Specifically, no fracture, subluxation, or dislocation.
Soft tissues are intact.
IMPRESSION: Degenerative changes in the right AC joint. No acute bony
abnormality.

## 2021-11-23 ENCOUNTER — Emergency Department
Admission: EM | Admit: 2021-11-23 | Discharge: 2021-11-23 | Disposition: A | Discharge status: Home / Self Care | Payer: Medicare Other | Attending: Family Medicine | Admitting: Family Medicine

## 2021-11-23 ENCOUNTER — Encounter: Payer: Self-pay | Admitting: Emergency Medicine

## 2021-11-23 DIAGNOSIS — H6692 Otitis media, unspecified, left ear: Secondary | ICD-10-CM | POA: Diagnosis not present

## 2021-11-23 DIAGNOSIS — K122 Cellulitis and abscess of mouth: Secondary | ICD-10-CM | POA: Diagnosis not present

## 2021-11-23 MED ORDER — AMOXICILLIN-POT CLAVULANATE 875-125 MG PO TABS: 1.0000 | ORAL_TABLET | Freq: Two times a day (BID) | ORAL | 0 refills | Status: AC

## 2021-11-23 NOTE — ED Triage Notes (Signed)
Pt states she has had a sore throat for around 1 month. States pain is constant. No fever.

## 2021-11-23 NOTE — Discharge Instructions (Addendum)
Instructed patient to take medication as directed with food to completion.  Encouraged patient increase daily water intake while taking this medication.  Advised patient if symptoms worsen please follow-up with PCP or here for further evaluation.

## 2021-11-23 NOTE — ED Provider Notes (Signed)
[facility]    CSN: [id number] Arrival date & time: 11/23/21  1255      History   Chief Complaint Chief Complaint  Patient presents with   Sore Throat    HPI Jennifer Benson is a 56 y.o. female.   HPI Pleasant 56 year old female presents with sore throat for 1 month and reports pain has been constant.  Patient reports not taking her medications for almost 1 year as her PCP retired and reports that she needs new PCP that accepts Medicare.  PMH significant for COPD, MS, neuropathy, and current everyday cigarette smoker.  Past Medical History:  Diagnosis Date   Chest pain    Chronic pain    COPD (chronic obstructive pulmonary disease) ([facility])    Heart palpitations    MS (multiple sclerosis) ([facility])    Neuropathy    Osteoarthritis    Renal calculi    Right renal atrophy    SOB (shortness of breath)    Thyroid disease     Patient Active Problem List   Diagnosis Date Noted   Acute hypoxemic respiratory failure ([place]) 01/06/2015   Bacteria in urine 01/06/2015   Cervical radiculopathy 01/06/2015   COPD exacerbation ([place]) 01/06/2015   Chest pain 03/30/2012   Smoking 03/30/2012   HYPOTHYROIDISM 07/20/2009   FOOT PAIN, LEFT 07/20/2009    Past Surgical History:  Procedure Laterality Date   CESAREAN SECTION     CHOLECYSTECTOMY     KIDNEY STONE SURGERY     LAPAROSCOPIC NEPHRECTOMY     NEPHRECTOMY Right     OB History   No obstetric history on file.      Home Medications    Prior to Admission medications   Medication Sig Start Date End Date Taking? Authorizing Provider  amoxicillin-clavulanate (AUGMENTIN) 875-125 MG tablet Take 1 tablet by mouth 2 (two) times daily for 10 days. 11/23/21 12/03/21 Yes Eliezer Lofts, FNP  albuterol (PROVENTIL HFA;VENTOLIN HFA) 108 (90 Base) MCG/ACT inhaler Inhale 1-2 puffs into the lungs every 6 (six) hours as needed for wheezing or shortness of breath. 08/24/15   Noe Gens, PA-C  amphetamine-dextroamphetamine (ADDERALL) 30  MG tablet Take 30 mg by mouth daily.    [provider]  aspirin 325 MG tablet Take 325 mg by mouth once.    [provider]  baclofen (LIORESAL) 10 MG tablet Take 10 mg by mouth 3 (three) times daily.    [provider]  cyanocobalamin (,VITAMIN B-12,) 1000 MCG/ML injection Inject into the muscle. 09/21/19   [provider]  cyanocobalamin 1000 MCG tablet Take 1,000 mcg by mouth daily.    [provider]  GABAPENTIN & LIDOCAINE-MENTHOL CO by Combination route.    [provider]  gabapentin (NEURONTIN) 300 MG capsule Take 300 mg by mouth 3 (three) times daily.    [provider]  lamoTRIgine (LAMICTAL) 150 MG tablet  11/10/18   [provider]  levothyroxine (SYNTHROID, LEVOTHROID) 150 MCG tablet Take 150 mcg by mouth daily.    [provider]  meloxicam (MOBIC) 7.5 MG tablet Take 7.5 mg by mouth daily.    [provider]  oxycodone-acetaminophen (LYNOX) 7.5-300 MG tablet Take 1 tablet by mouth every 4 (four) hours as needed for pain.    [provider]  predniSONE (DELTASONE) 20 MG tablet 2 daily with food for a week, then one a day for a week 02/25/19   Robyn Haber, MD  propranolol ER (INDERAL LA) 60 MG 24 hr capsule  Take one tab by mouth at bedtime 03/26/13   Kandra Nicolas, MD  traMADol-acetaminophen (ULTRACET) 37.5-325 MG per tablet 1 or 2 every 4-6 hours as needed for moderate-severe pain.  Caution: May cause drowsiness 09/28/13   Jacqulyn Cane, MD  Vitamin D, Ergocalciferol, (DRISDOL) 1.25 MG (50000 UNIT) CAPS capsule Take 50,000 Units by mouth once a week. 08/11/19   [provider]    Family History Family History  Problem Relation Age of Onset   Thyroid disease Mother    Heart attack Other        on mothers side of the family    Stroke Other        on mothers side of the family     Social History Social History   Tobacco Use   Smoking status: Every Day     Packs/day: 0.75    Years: 35.00    Total pack years: 26.25    Types: Cigarettes   Smokeless tobacco: Never  Vaping Use   Vaping Use: Never used  Substance Use Topics   Alcohol use: No   Drug use: No     Allergies   Demerol   Review of Systems Review of Systems  HENT:  Positive for sore throat.   Gastrointestinal:  Positive for abdominal distention.  Psychiatric/Behavioral:  Positive for agitation.   All other systems reviewed and are negative.    Physical Exam Triage Vital Signs ED Triage Vitals  Enc Vitals Group     BP 11/23/21 1309 133/85     Pulse Rate 11/23/21 1309 69     Resp 11/23/21 1309 18     Temp 11/23/21 1309 98.6 F (37 C)     Temp Source 11/23/21 1309 Oral     SpO2 11/23/21 1309 95 %     Weight --      Height --      Head Circumference --      Peak Flow --      Pain Score 11/23/21 1312 6     Pain Loc --      Pain Edu? --      Excl. in [place]? --    No data found.  Updated Vital Signs BP 133/85 (BP Location: Right Arm)   Pulse 69   Temp 98.6 F (37 C) (Oral)   Resp 18   LMP 08/24/2015   SpO2 95%    Physical Exam Vitals and nursing note reviewed.  Constitutional:      General: She is not in acute distress.    Appearance: She is obese. She is not ill-appearing.  HENT:     Head: Normocephalic and atraumatic.     Right Ear: Tympanic membrane, ear canal and external ear normal.     Left Ear: Ear canal and external ear normal.     Ears:     Comments: Left TM: Erythematous, retracted    Mouth/Throat:     Mouth: Mucous membranes are moist.     Pharynx: Oropharynx is clear. Uvula midline. Posterior oropharyngeal erythema and uvula swelling present.  Eyes:     Extraocular Movements: Extraocular movements intact.     Conjunctiva/sclera: Conjunctivae normal.     Pupils: Pupils are equal, round, and reactive to light.  Cardiovascular:     Rate and Rhythm: Normal rate and regular rhythm.     Pulses: Normal pulses.     Heart sounds: Normal heart  sounds. No murmur heard. Pulmonary:     Effort: Pulmonary effort is normal.  Breath sounds: Normal breath sounds. No wheezing, rhonchi or rales.  Musculoskeletal:     Cervical back: Normal range of motion and neck supple. No tenderness.  Lymphadenopathy:     Cervical: No cervical adenopathy.  Skin:    General: Skin is warm and dry.  Neurological:     General: No focal deficit present.     Mental Status: She is alert and oriented to person, place, and time.      UC Treatments / Results  Labs (all labs ordered are listed, but only abnormal results are displayed) Labs Reviewed - No data to display  EKG   Radiology No results found.  Procedures Procedures (including critical care time)  Medications Ordered in UC Medications - No data to display  Initial Impression / Assessment and Plan / UC Course  I have reviewed the triage vital signs and the nursing notes.  Pertinent labs & imaging results that were available during my care of the patient were reviewed by me and considered in my medical decision making (see chart for details).     MDM: 1.  Uvulitis-Rx'd Augmentin; 2.  Left acute otitis media-Rx'd Augmentin. Instructed patient to take medication as directed with food to completion.  Encouraged patient increase daily water intake while taking this medication.  Advised patient if symptoms worsen please follow-up with PCP or here for further evaluation.  Final Clinical Impressions(s) / UC Diagnoses   Final diagnoses:  Uvulitis  Left acute otitis media     Discharge Instructions      Instructed patient to take medication as directed with food to completion.  Encouraged patient increase daily water intake while taking this medication.  Advised patient if symptoms worsen please follow-up with PCP or here for further evaluation.     ED Prescriptions     Medication Sig Dispense Auth. Provider   amoxicillin-clavulanate (AUGMENTIN) 875-125 MG tablet Take 1 tablet  by mouth 2 (two) times daily for 10 days. 20 tablet Eliezer Lofts, FNP      PDMP not reviewed this encounter.   Eliezer Lofts, [place] 11/23/21 1343

## 2021-11-23 NOTE — ED Triage Notes (Signed)
Pt states she has not taken her meds in almost 1 year due to her PCP retirement. She needs new PCP that accepts medicare. Will give patient handout on PCP and advised she needs to make an appt asap

## 2022-08-09 ENCOUNTER — Emergency Department (HOSPITAL_COMMUNITY)
Admission: EM | Admit: 2022-08-09 | Discharge: 2022-08-09 | Disposition: A | Discharge status: Home / Self Care | Payer: Medicare Other | Attending: Student | Admitting: Student

## 2022-08-09 ENCOUNTER — Encounter (HOSPITAL_COMMUNITY): Payer: Self-pay

## 2022-08-09 DIAGNOSIS — D72829 Elevated white blood cell count, unspecified: Secondary | ICD-10-CM | POA: Insufficient documentation

## 2022-08-09 DIAGNOSIS — R300 Dysuria: Secondary | ICD-10-CM | POA: Diagnosis present

## 2022-08-09 DIAGNOSIS — N390 Urinary tract infection, site not specified: Secondary | ICD-10-CM | POA: Diagnosis not present

## 2022-08-09 DIAGNOSIS — R319 Hematuria, unspecified: Secondary | ICD-10-CM | POA: Insufficient documentation

## 2022-08-09 DIAGNOSIS — F17210 Nicotine dependence, cigarettes, uncomplicated: Secondary | ICD-10-CM | POA: Diagnosis not present

## 2022-08-09 DIAGNOSIS — J449 Chronic obstructive pulmonary disease, unspecified: Secondary | ICD-10-CM | POA: Diagnosis not present

## 2022-08-09 LAB — COMPREHENSIVE METABOLIC PANEL
ALT: 28 U/L (ref 0–44)
AST: 26 U/L (ref 15–41)
Albumin: 4.1 g/dL (ref 3.5–5.0)
Alkaline Phosphatase: 58 U/L (ref 38–126)
Anion gap: 9 (ref 5–15)
BUN: 17 mg/dL (ref 6–20)
CO2: 22 mmol/L (ref 22–32)
Calcium: 8.5 mg/dL — ABNORMAL LOW (ref 8.9–10.3)
Chloride: 106 mmol/L (ref 98–111)
Creatinine, Ser: 0.82 mg/dL (ref 0.44–1.00)
GFR, Estimated: >60 mL/min (ref >60)
Glucose, Bld: 105 mg/dL — ABNORMAL HIGH (ref 70–99)
Potassium: 4 mmol/L (ref 3.5–5.1)
Sodium: 137 mmol/L (ref 135–145)
Total Bilirubin: 1 mg/dL (ref 0.3–1.2)
Total Protein: 7.7 g/dL (ref 6.5–8.1)

## 2022-08-09 LAB — URINALYSIS, ROUTINE W REFLEX MICROSCOPIC
Bacteria, UA: NONE SEEN
Bilirubin Urine: NEGATIVE
Bilirubin Urine: NEGATIVE
Glucose, UA: NEGATIVE mg/dL
Glucose, UA: NEGATIVE mg/dL
Ketones, ur: 5 mg/dL — AB
Ketones, ur: 5 mg/dL — AB
Nitrite: NEGATIVE
Nitrite: NEGATIVE
Protein, ur: NEGATIVE mg/dL
Protein, ur: 30 mg/dL — AB
Specific Gravity, Urine: 1.027 (ref 1.005–1.030)
Specific Gravity, Urine: 1.029 (ref 1.005–1.030)
pH: 5 (ref 5.0–8.0)
pH: 5 (ref 5.0–8.0)

## 2022-08-09 LAB — CBC WITH DIFFERENTIAL/PLATELET
Abs Immature Granulocytes: 0.05 10*3/uL (ref 0.00–0.07)
Basophils Absolute: 0.1 10*3/uL (ref 0.0–0.1)
Basophils Relative: 1 %
Eosinophils Absolute: 0.4 10*3/uL (ref 0.0–0.5)
Eosinophils Relative: 3 %
HCT: 44.4 % (ref 36.0–46.0)
Hemoglobin: 14.5 g/dL (ref 12.0–15.0)
Immature Granulocytes: 0 %
Lymphocytes Relative: 27 %
Lymphs Abs: 3.2 10*3/uL (ref 0.7–4.0)
MCH: 30 pg (ref 26.0–34.0)
MCHC: 32.7 g/dL (ref 30.0–36.0)
MCV: 91.7 fL (ref 80.0–100.0)
Monocytes Absolute: 1 10*3/uL (ref 0.1–1.0)
Monocytes Relative: 8 %
Neutro Abs: 7 10*3/uL (ref 1.7–7.7)
Neutrophils Relative %: 61 %
Platelets: 287 10*3/uL (ref 150–400)
RBC: 4.84 MIL/uL (ref 3.87–5.11)
RDW: 13.2 % (ref 11.5–15.5)
WBC: 11.7 10*3/uL — ABNORMAL HIGH (ref 4.0–10.5)
nRBC: 0 % (ref 0.0–0.2)

## 2022-08-09 LAB — LIPASE, BLOOD: Lipase: 36 U/L (ref 11–51)

## 2022-08-09 MED ORDER — CEFADROXIL 500 MG PO CAPS
500.0000 mg | ORAL_CAPSULE | Freq: Two times a day (BID) | ORAL | Status: DC
  Administered 2022-08-09: 500 mg via ORAL
  Filled 2022-08-09: qty 1

## 2022-08-09 MED ORDER — CEFADROXIL 500 MG PO CAPS: 500.0000 mg | ORAL_CAPSULE | Freq: Two times a day (BID) | ORAL | 0 refills | Status: AC

## 2022-08-09 NOTE — ED Provider Notes (Signed)
EMERGENCY DEPARTMENT AT [facility] Provider Note  CSN: [id number] Arrival date & time: 08/09/22 1304  Chief Complaint(s) Abdominal Pain and Burning with urination  HPI Jennifer Benson is a 57 y.o. female with PMH MS, COPD, solitary kidney, nephrolithiasis who presents emergency room for evaluation of abdominal pain and dysuria.  Patient states that for the last 24 hours she has noticed worsening dysuria and hematuria but denies flank or back pain.  Denies fever, chills, nausea, vomiting or other systemic symptoms.  Denies vaginal bleeding or discharge.   Past Medical History Past Medical History:  Diagnosis Date   Chest pain    Chronic pain    COPD (chronic obstructive pulmonary disease) (HCC)    Heart palpitations    MS (multiple sclerosis) ([place])    Neuropathy    Osteoarthritis    Renal calculi    Right renal atrophy    SOB (shortness of breath)    Thyroid disease    Patient Active Problem List   Diagnosis Date Noted   Acute hypoxemic respiratory failure ([place]) 01/06/2015   Bacteria in urine 01/06/2015   Cervical radiculopathy 01/06/2015   COPD exacerbation ([place]) 01/06/2015   Chest pain 03/30/2012   Smoking 03/30/2012   HYPOTHYROIDISM 07/20/2009   FOOT PAIN, LEFT 07/20/2009   Home Medication(s) Prior to Admission medications   Medication Sig Start Date End Date Taking? Authorizing Provider  cefadroxil (DURICEF) 500 MG capsule Take 1 capsule (500 mg total) by mouth 2 (two) times daily for 7 days. 08/09/22 08/16/22 Yes Demetric Parslow, MD  albuterol (PROVENTIL HFA;VENTOLIN HFA) 108 (90 Base) MCG/ACT inhaler Inhale 1-2 puffs into the lungs every 6 (six) hours as needed for wheezing or shortness of breath. 08/24/15   Noe Gens, PA-C  amphetamine-dextroamphetamine (ADDERALL) 30 MG tablet Take 30 mg by mouth daily.    [provider]  aspirin 325 MG tablet Take 325 mg by mouth once.    [provider]  baclofen (LIORESAL) 10 MG tablet  Take 10 mg by mouth 3 (three) times daily.    [provider]  cyanocobalamin (,VITAMIN B-12,) 1000 MCG/ML injection Inject into the muscle. 09/21/19   [provider]  cyanocobalamin 1000 MCG tablet Take 1,000 mcg by mouth daily.    [provider]  GABAPENTIN & LIDOCAINE-MENTHOL CO by Combination route.    [provider]  gabapentin (NEURONTIN) 300 MG capsule Take 300 mg by mouth 3 (three) times daily.    [provider]  lamoTRIgine (LAMICTAL) 150 MG tablet  11/10/18   [provider]  levothyroxine (SYNTHROID, LEVOTHROID) 150 MCG tablet Take 150 mcg by mouth daily.    [provider]  meloxicam (MOBIC) 7.5 MG tablet Take 7.5 mg by mouth daily.    [provider]  oxycodone-acetaminophen (LYNOX) 7.5-300 MG tablet Take 1 tablet by mouth every 4 (four) hours as needed for pain.    [provider]  predniSONE (DELTASONE) 20 MG tablet 2 daily with food for a week, then one a day for a week 02/25/19   Robyn Haber, MD  propranolol ER (INDERAL LA) 60 MG 24 hr capsule Take one tab by mouth at bedtime 03/26/13   Kandra Nicolas, MD  traMADol-acetaminophen (ULTRACET) 37.5-325 MG per tablet 1 or 2 every 4-6 hours as needed for moderate-severe pain.  Caution: May cause drowsiness 09/28/13   Jacqulyn Cane, MD  Vitamin D, Ergocalciferol, (DRISDOL) 1.25 MG (50000 UNIT) CAPS capsule Take 50,000 Units by mouth once  a week. 08/11/19   [provider]                                                                                                                                    Past Surgical History Past Surgical History:  Procedure Laterality Date   CESAREAN SECTION     CHOLECYSTECTOMY     KIDNEY STONE SURGERY     LAPAROSCOPIC NEPHRECTOMY     NEPHRECTOMY Right    Family History Family History  Problem Relation Age of Onset   Thyroid disease Mother    Heart attack Other        on mothers side of the family     Stroke Other        on mothers side of the family     Social History Social History   Tobacco Use   Smoking status: Every Day    Packs/day: 0.75    Years: 35.00    Additional pack years: 0.00    Total pack years: 26.25    Types: Cigarettes   Smokeless tobacco: Never  Vaping Use   Vaping Use: Never used  Substance Use Topics   Alcohol use: No   Drug use: No   Allergies Demerol  Review of Systems Review of Systems  Genitourinary:  Positive for dysuria and hematuria.    Physical Exam Vital Signs  I have reviewed the triage vital signs BP 134/65   Pulse 62   Temp 97.6 F (36.4 C) (Oral)   Resp 18   LMP 08/24/2015   SpO2 95%   Physical Exam Vitals and nursing note reviewed.  Constitutional:      General: She is not in acute distress.    Appearance: She is well-developed.  HENT:     Head: Normocephalic and atraumatic.  Eyes:     Conjunctiva/sclera: Conjunctivae normal.  Cardiovascular:     Rate and Rhythm: Normal rate and regular rhythm.     Heart sounds: No murmur heard. Pulmonary:     Effort: Pulmonary effort is normal. No respiratory distress.     Breath sounds: Normal breath sounds.  Abdominal:     Palpations: Abdomen is soft.     Tenderness: There is no abdominal tenderness.  Musculoskeletal:        General: No swelling.     Cervical back: Neck supple.  Skin:    General: Skin is warm and dry.     Capillary Refill: Capillary refill takes less than 2 seconds.  Neurological:     Mental Status: She is alert.  Psychiatric:        Mood and Affect: Mood normal.     ED Results and Treatments Labs (all labs ordered are listed, but only abnormal results are displayed) Labs Reviewed  CBC WITH DIFFERENTIAL/PLATELET - Abnormal; Notable for the following components:      Result Value   WBC 11.7 (*)    All  other components within normal limits  COMPREHENSIVE METABOLIC PANEL - Abnormal; Notable for the following components:   Glucose, Bld 105 (*)     Calcium 8.5 (*)    All other components within normal limits  URINALYSIS, ROUTINE W REFLEX MICROSCOPIC - Abnormal; Notable for the following components:   APPearance HAZY (*)    Hgb urine dipstick MODERATE (*)    Ketones, ur 5 (*)    Leukocytes,Ua MODERATE (*)    Bacteria, UA RARE (*)    All other components within normal limits  URINALYSIS, ROUTINE W REFLEX MICROSCOPIC - Abnormal; Notable for the following components:   APPearance HAZY (*)    Hgb urine dipstick MODERATE (*)    Ketones, ur 5 (*)    Protein, ur 30 (*)    Leukocytes,Ua MODERATE (*)    All other components within normal limits  LIPASE, BLOOD                                                                                                                          Radiology No results found.  Pertinent labs & imaging results that were available during my care of the patient were reviewed by me and considered in my medical decision making (see MDM for details).  Medications Ordered in ED Medications  cefadroxil (DURICEF) capsule 500 mg (500 mg Oral Given 08/09/22 1608)                                                                                                                                     Procedures Procedures  (including critical care time)  Medical Decision Making / ED Course   This patient presents to the ED for concern of dysuria, hematuria, this involves an extensive number of treatment options, and is a complaint that carries with it a high risk of complications and morbidity.  The differential diagnosis includes UTI, bladder cancer, interstitial cystitis, vaginal foreign body, vaginal bleeding, nephrolithiasis  MDM: Patient seen emergency room for evaluation of dysuria and hematuria.  Physical exam is largely unremarkable .  Laboratory evaluation with a leukocytosis to 11.7 but is otherwise unremarkable.  Initial urinalysis showing moderate leuk esterase, 11-20 red blood cells, 6-10 white blood cells  and rare bacteria but 6-10 squamous cells.  With the squamous cell contamination, we attempted to obtain an additional sample that again shows moderate leuk esterase, 11-20 red blood cells but  now 21-50 white blood cells and squamous cells still present.  Given that the patient is having active symptoms of dysuria and hematuria, we will cover with Duricef.  Patient otherwise hemodynamically stable and patient discharged with outpatient follow-up and return precautions which she voiced understanding.  Patient currently does not meet inpatient criteria for admission   Additional history obtained: -External records from outside source obtained and reviewed including: Chart review including previous notes, labs, imaging, consultation notes   Lab Tests: -I ordered, reviewed, and interpreted labs.   The pertinent results include:   Labs Reviewed  CBC WITH DIFFERENTIAL/PLATELET - Abnormal; Notable for the following components:      Result Value   WBC 11.7 (*)    All other components within normal limits  COMPREHENSIVE METABOLIC PANEL - Abnormal; Notable for the following components:   Glucose, Bld 105 (*)    Calcium 8.5 (*)    All other components within normal limits  URINALYSIS, ROUTINE W REFLEX MICROSCOPIC - Abnormal; Notable for the following components:   APPearance HAZY (*)    Hgb urine dipstick MODERATE (*)    Ketones, ur 5 (*)    Leukocytes,Ua MODERATE (*)    Bacteria, UA RARE (*)    All other components within normal limits  URINALYSIS, ROUTINE W REFLEX MICROSCOPIC - Abnormal; Notable for the following components:   APPearance HAZY (*)    Hgb urine dipstick MODERATE (*)    Ketones, ur 5 (*)    Protein, ur 30 (*)    Leukocytes,Ua MODERATE (*)    All other components within normal limits  LIPASE, BLOOD       Medicines ordered and prescription drug management: Meds ordered this encounter  Medications   cefadroxil (DURICEF) capsule 500 mg   cefadroxil (DURICEF) 500 MG capsule     Sig: Take 1 capsule (500 mg total) by mouth 2 (two) times daily for 7 days.    Dispense:  14 capsule    Refill:  0    -I have reviewed the patients home medicines and have made adjustments as needed  Critical interventions none   Cardiac Monitoring: The patient was maintained on a cardiac monitor.  I personally viewed and interpreted the cardiac monitored which showed an underlying rhythm of: NSR  Social Determinants of Health:  Factors impacting patients care include: none   Reevaluation: After the interventions noted above, I reevaluated the patient and found that they have :improved  Co morbidities that complicate the patient evaluation  Past Medical History:  Diagnosis Date   Chest pain    Chronic pain    COPD (chronic obstructive pulmonary disease) (HCC)    Heart palpitations    MS (multiple sclerosis) ([place])    Neuropathy    Osteoarthritis    Renal calculi    Right renal atrophy    SOB (shortness of breath)    Thyroid disease       Dispostion: I considered admission for this patient, but she does not meet inpatient criteria for admission she is safe for discharge with outpatient follow-up     Final Clinical Impression(s) / ED Diagnoses Final diagnoses:  Urinary tract infection with hematuria, site unspecified     @PCDICTATION @    Teressa Lower, MD 08/09/22 [phone]

## 2022-08-09 NOTE — ED Notes (Signed)
Pt ambulatory to bathroom for urine sample.

## 2022-08-09 NOTE — ED Triage Notes (Addendum)
Pt presents with c/o abdominal pain and some burning with urination. Pt reports that she also has some hematuria. Pt denies any flank pain at this time. Pt reports issues with her kidneys and that she only has one. Pt reports symptoms started this morning.

## 2022-08-09 NOTE — ED Notes (Signed)
Dr. Matilde Sprang requests second urine sample, pt ambulatory to bathroom to provide second sample

## 2022-09-25 ENCOUNTER — Other Ambulatory Visit: Payer: Self-pay | Admitting: Family Medicine

## 2022-09-25 DIAGNOSIS — Z1231 Encounter for screening mammogram for malignant neoplasm of breast: Secondary | ICD-10-CM

## 2022-11-01 ENCOUNTER — Other Ambulatory Visit: Payer: Self-pay | Admitting: Family Medicine

## 2022-11-01 DIAGNOSIS — Z13820 Encounter for screening for osteoporosis: Secondary | ICD-10-CM

## 2022-11-06 ENCOUNTER — Ambulatory Visit (INDEPENDENT_AMBULATORY_CARE_PROVIDER_SITE_OTHER): Payer: Medicare Other

## 2022-11-06 DIAGNOSIS — Z1231 Encounter for screening mammogram for malignant neoplasm of breast: Secondary | ICD-10-CM | POA: Diagnosis not present

## 2022-11-20 ENCOUNTER — Ambulatory Visit (INDEPENDENT_AMBULATORY_CARE_PROVIDER_SITE_OTHER): Payer: Medicare Other

## 2022-11-20 DIAGNOSIS — Z780 Asymptomatic menopausal state: Secondary | ICD-10-CM

## 2022-11-20 DIAGNOSIS — Z13820 Encounter for screening for osteoporosis: Secondary | ICD-10-CM

## 2023-04-30 DIAGNOSIS — G43119 Migraine with aura, intractable, without status migrainosus: Secondary | ICD-10-CM | POA: Diagnosis not present

## 2023-04-30 DIAGNOSIS — E039 Hypothyroidism, unspecified: Secondary | ICD-10-CM | POA: Diagnosis not present

## 2023-04-30 DIAGNOSIS — Z79899 Other long term (current) drug therapy: Secondary | ICD-10-CM | POA: Diagnosis not present

## 2023-04-30 DIAGNOSIS — J4520 Mild intermittent asthma, uncomplicated: Secondary | ICD-10-CM | POA: Diagnosis not present

## 2023-04-30 DIAGNOSIS — E782 Mixed hyperlipidemia: Secondary | ICD-10-CM | POA: Diagnosis not present

## 2023-04-30 DIAGNOSIS — G5793 Unspecified mononeuropathy of bilateral lower limbs: Secondary | ICD-10-CM | POA: Diagnosis not present

## 2023-04-30 DIAGNOSIS — F17210 Nicotine dependence, cigarettes, uncomplicated: Secondary | ICD-10-CM | POA: Diagnosis not present

## 2023-04-30 DIAGNOSIS — J449 Chronic obstructive pulmonary disease, unspecified: Secondary | ICD-10-CM | POA: Diagnosis not present

## 2023-04-30 DIAGNOSIS — G35 Multiple sclerosis: Secondary | ICD-10-CM | POA: Diagnosis not present

## 2023-04-30 DIAGNOSIS — M21371 Foot drop, right foot: Secondary | ICD-10-CM | POA: Diagnosis not present

## 2023-10-11 DIAGNOSIS — E782 Mixed hyperlipidemia: Secondary | ICD-10-CM | POA: Diagnosis not present

## 2023-10-11 DIAGNOSIS — J4520 Mild intermittent asthma, uncomplicated: Secondary | ICD-10-CM | POA: Diagnosis not present

## 2023-10-11 DIAGNOSIS — J449 Chronic obstructive pulmonary disease, unspecified: Secondary | ICD-10-CM | POA: Diagnosis not present

## 2023-11-06 DIAGNOSIS — Z131 Encounter for screening for diabetes mellitus: Secondary | ICD-10-CM | POA: Diagnosis not present

## 2023-11-06 DIAGNOSIS — J449 Chronic obstructive pulmonary disease, unspecified: Secondary | ICD-10-CM | POA: Diagnosis not present

## 2023-11-06 DIAGNOSIS — E538 Deficiency of other specified B group vitamins: Secondary | ICD-10-CM | POA: Diagnosis not present

## 2023-11-06 DIAGNOSIS — R140 Abdominal distension (gaseous): Secondary | ICD-10-CM | POA: Diagnosis not present

## 2023-11-06 DIAGNOSIS — R300 Dysuria: Secondary | ICD-10-CM | POA: Diagnosis not present

## 2023-11-06 DIAGNOSIS — R1012 Left upper quadrant pain: Secondary | ICD-10-CM | POA: Diagnosis not present

## 2023-11-06 DIAGNOSIS — E039 Hypothyroidism, unspecified: Secondary | ICD-10-CM | POA: Diagnosis not present

## 2023-11-06 DIAGNOSIS — E782 Mixed hyperlipidemia: Secondary | ICD-10-CM | POA: Diagnosis not present

## 2023-11-10 DIAGNOSIS — J4520 Mild intermittent asthma, uncomplicated: Secondary | ICD-10-CM | POA: Diagnosis not present

## 2023-11-10 DIAGNOSIS — E782 Mixed hyperlipidemia: Secondary | ICD-10-CM | POA: Diagnosis not present

## 2023-11-10 DIAGNOSIS — J449 Chronic obstructive pulmonary disease, unspecified: Secondary | ICD-10-CM | POA: Diagnosis not present

## 2023-11-13 DIAGNOSIS — R1012 Left upper quadrant pain: Secondary | ICD-10-CM | POA: Diagnosis not present

## 2023-12-01 DIAGNOSIS — E039 Hypothyroidism, unspecified: Secondary | ICD-10-CM | POA: Diagnosis not present

## 2023-12-11 DIAGNOSIS — E782 Mixed hyperlipidemia: Secondary | ICD-10-CM | POA: Diagnosis not present

## 2023-12-11 DIAGNOSIS — J449 Chronic obstructive pulmonary disease, unspecified: Secondary | ICD-10-CM | POA: Diagnosis not present

## 2023-12-11 DIAGNOSIS — J4520 Mild intermittent asthma, uncomplicated: Secondary | ICD-10-CM | POA: Diagnosis not present

## 2024-01-11 DIAGNOSIS — J449 Chronic obstructive pulmonary disease, unspecified: Secondary | ICD-10-CM | POA: Diagnosis not present

## 2024-01-11 DIAGNOSIS — E782 Mixed hyperlipidemia: Secondary | ICD-10-CM | POA: Diagnosis not present

## 2024-01-11 DIAGNOSIS — J4520 Mild intermittent asthma, uncomplicated: Secondary | ICD-10-CM | POA: Diagnosis not present

## 2024-02-10 DIAGNOSIS — G35 Multiple sclerosis: Secondary | ICD-10-CM | POA: Diagnosis not present

## 2024-02-10 DIAGNOSIS — J4520 Mild intermittent asthma, uncomplicated: Secondary | ICD-10-CM | POA: Diagnosis not present

## 2024-02-10 DIAGNOSIS — E782 Mixed hyperlipidemia: Secondary | ICD-10-CM | POA: Diagnosis not present

## 2024-02-10 DIAGNOSIS — G5793 Unspecified mononeuropathy of bilateral lower limbs: Secondary | ICD-10-CM | POA: Diagnosis not present

## 2024-02-10 DIAGNOSIS — E559 Vitamin D deficiency, unspecified: Secondary | ICD-10-CM | POA: Diagnosis not present

## 2024-02-10 DIAGNOSIS — J449 Chronic obstructive pulmonary disease, unspecified: Secondary | ICD-10-CM | POA: Diagnosis not present

## 2024-02-10 DIAGNOSIS — E039 Hypothyroidism, unspecified: Secondary | ICD-10-CM | POA: Diagnosis not present

## 2024-02-10 DIAGNOSIS — Z23 Encounter for immunization: Secondary | ICD-10-CM | POA: Diagnosis not present

## 2024-02-10 DIAGNOSIS — R1012 Left upper quadrant pain: Secondary | ICD-10-CM | POA: Diagnosis not present

## 2024-02-10 DIAGNOSIS — F17200 Nicotine dependence, unspecified, uncomplicated: Secondary | ICD-10-CM | POA: Diagnosis not present

## 2024-02-24 DIAGNOSIS — Z0000 Encounter for general adult medical examination without abnormal findings: Secondary | ICD-10-CM | POA: Diagnosis not present

## 2024-02-24 DIAGNOSIS — Z1231 Encounter for screening mammogram for malignant neoplasm of breast: Secondary | ICD-10-CM | POA: Diagnosis not present

## 2024-03-22 ENCOUNTER — Telehealth: Payer: Self-pay | Admitting: Acute Care

## 2024-03-22 DIAGNOSIS — Z87891 Personal history of nicotine dependence: Secondary | ICD-10-CM

## 2024-03-22 DIAGNOSIS — F17210 Nicotine dependence, cigarettes, uncomplicated: Secondary | ICD-10-CM

## 2024-03-22 DIAGNOSIS — Z122 Encounter for screening for malignant neoplasm of respiratory organs: Secondary | ICD-10-CM

## 2024-03-22 NOTE — Telephone Encounter (Signed)
 Lung Cancer Screening Narrative/Criteria Questionnaire (Cigarette Smokers Only- No Cigars/Pipes/vapes)   [name]   SDMV:03/31/24 at 0930a/KATY                                           1965-07-28              LDCT: 04/01/24 @0930a /[facility]    58 y.o.   Phone: [phone]  Lung Screening Narrative (confirm age 44-77 yrs Medicare / 50-80 yrs Private pay insurance)   Insurance information:[employer]   Referring Provider:Miller   This screening involves an initial phone call with a team member from our program. It is called a shared decision making visit. The initial meeting is required by insurance and Medicare to make sure you understand the program. This appointment takes about 15-20 minutes to complete. The CT scan will completed at a separate date/time. This scan takes about 5-10 minutes to complete and you may eat and drink before and after the scan.  Criteria questions for Lung Cancer Screening:   Are you a current or former smoker? Current Age began smoking: 12y   If you are a former smoker, what year did you quit smoking? Current but quit 72yrs previously   To calculate your smoking history, I need an accurate estimate of how many packs of cigarettes you smoked per day and for how many years. (Not just the number of PPD you are now smoking)   Years smoking 42 x Packs per day 1/2 to 3/4 = Pack years 26   (at least 20 pack yrs)   (Make sure they understand that we need to know how much they have smoked in the past, not just the number of PPD they are smoking now)  Do you have a personal history of cancer?  No    Do you have a family history of cancer? Yes  (cancer type and and relative) pGF/lung  Are you coughing up blood?  No  Have you had unexplained weight loss of 15 lbs or more in the last 6 months? No  It looks like you meet all criteria.     Additional information: N/A

## 2024-03-31 ENCOUNTER — Encounter: Payer: Self-pay | Admitting: Adult Health

## 2024-03-31 ENCOUNTER — Ambulatory Visit: Admitting: Adult Health

## 2024-03-31 DIAGNOSIS — F17210 Nicotine dependence, cigarettes, uncomplicated: Secondary | ICD-10-CM

## 2024-03-31 NOTE — Patient Instructions (Signed)

## 2024-03-31 NOTE — Progress Notes (Signed)
  Virtual Visit via Telephone Note  I connected with TRINH SANJOSE , 03/31/24 9:34 AM by a telemedicine application and verified that I am speaking with the correct person using two identifiers.  Location: Patient: home Provider: home   I discussed the limitations of evaluation and management by telemedicine and the availability of in person appointments. The patient expressed understanding and agreed to proceed.   Shared Decision Making Visit Lung Cancer Screening Program [phone])   Eligibility: 58 y.o. Pack Years Smoking History Calculation = 26 pack years  (# packs/per year x # years smoked) Recent History of coughing up blood  no Unexplained weight loss? no ( >Than 15 pounds within the last 6 months ) Prior History Lung / other cancer no (Diagnosis within the last 5 years already requiring surveillance chest CT Scans). Smoking Status Current Smoker   Visit Components: Discussion included one or more decision making aids. YES Discussion included risk/benefits of screening. YES Discussion included potential follow up diagnostic testing for abnormal scans. YES Discussion included meaning and risk of over diagnosis. YES Discussion included meaning and risk of False Positives. YES Discussion included meaning of total radiation exposure. YES  Counseling Included: Importance of adherence to annual lung cancer LDCT screening. YES Impact of comorbidities on ability to participate in the program. YES Ability and willingness to under diagnostic treatment. YES  Smoking Cessation Counseling: Current Smokers:  Discussed importance of smoking cessation. yes Information about tobacco cessation classes and interventions provided to patient. yes Patient provided with ticket for LDCT Scan. yes Symptomatic Patient. NO Diagnosis Code: Tobacco Use Z72.0 Asymptomatic Patient yes  Counseling - 4 minutes of smoking cessation counseling  (CT Chest Lung Cancer Screening Low Dose W/O CM)  [id number]  Smoking/Tobacco Cessation Counseling SHELLY SPENSER is a current user of tobacco or nicotine products. She is considering quitting at this time. Counseling provided today addressed the risks of continued use and the benefits of cessation. Discussed tobacco/nicotine use history, readiness to quit, and evidence-based treatment options including behavioral strategies, support resources, and pharmacologic therapies. Provided encouragement and educational materials on steps and resources to quit smoking. Patient questions were addressed, and follow-up recommended for continued support. Total time spent on counseling: 3 minutes.    Z12.2-Screening of respiratory organs Z87.891-Personal history of nicotine dependence   Lamarr Myers 03/31/24

## 2024-04-01 ENCOUNTER — Ambulatory Visit (HOSPITAL_BASED_OUTPATIENT_CLINIC_OR_DEPARTMENT_OTHER)
Admission: RE | Admit: 2024-04-01 | Discharge: 2024-04-01 | Disposition: A | Discharge status: Home / Self Care | Source: Ambulatory Visit | Attending: Family Medicine | Admitting: Family Medicine

## 2024-04-01 DIAGNOSIS — Z122 Encounter for screening for malignant neoplasm of respiratory organs: Secondary | ICD-10-CM | POA: Insufficient documentation

## 2024-04-01 DIAGNOSIS — F17210 Nicotine dependence, cigarettes, uncomplicated: Secondary | ICD-10-CM | POA: Diagnosis present

## 2024-04-01 DIAGNOSIS — Z87891 Personal history of nicotine dependence: Secondary | ICD-10-CM | POA: Diagnosis present

## 2024-04-12 ENCOUNTER — Other Ambulatory Visit: Payer: Self-pay

## 2024-04-12 DIAGNOSIS — F17210 Nicotine dependence, cigarettes, uncomplicated: Secondary | ICD-10-CM

## 2024-04-12 DIAGNOSIS — Z87891 Personal history of nicotine dependence: Secondary | ICD-10-CM

## 2024-04-12 DIAGNOSIS — Z122 Encounter for screening for malignant neoplasm of respiratory organs: Secondary | ICD-10-CM

## 2024-06-03 ENCOUNTER — Other Ambulatory Visit: Payer: Self-pay | Admitting: Family Medicine

## 2024-06-03 DIAGNOSIS — R634 Abnormal weight loss: Secondary | ICD-10-CM

## 2024-06-21 ENCOUNTER — Inpatient Hospital Stay: Admission: RE | Admit: 2024-06-21 | Source: Ambulatory Visit
